# Patient Record
Sex: Male | Born: 1985 | Race: White | Hispanic: No | Marital: Married | Smoking: Former smoker
Health system: Southern US, Community
[De-identification: ages and names within clinical notes are randomized; demographics above are authoritative.]

## PROBLEM LIST (undated history)

## (undated) DIAGNOSIS — T8859XA Other complications of anesthesia, initial encounter: Secondary | ICD-10-CM

## (undated) DIAGNOSIS — Z8489 Family history of other specified conditions: Secondary | ICD-10-CM

## (undated) DIAGNOSIS — R519 Headache, unspecified: Secondary | ICD-10-CM

## (undated) DIAGNOSIS — K219 Gastro-esophageal reflux disease without esophagitis: Secondary | ICD-10-CM

## (undated) DIAGNOSIS — R51 Headache: Secondary | ICD-10-CM

## (undated) DIAGNOSIS — T4145XA Adverse effect of unspecified anesthetic, initial encounter: Secondary | ICD-10-CM

## (undated) HISTORY — PX: MOUTH SURGERY: SHX715

---

## 2004-08-19 HISTORY — PX: FRACTURE SURGERY: SHX138

## 2004-08-25 ENCOUNTER — Emergency Department: Payer: Self-pay | Admitting: Unknown Physician Specialty

## 2005-05-06 ENCOUNTER — Emergency Department: Payer: Self-pay | Admitting: Emergency Medicine

## 2009-05-05 ENCOUNTER — Emergency Department: Payer: Self-pay | Admitting: Emergency Medicine

## 2010-02-01 ENCOUNTER — Ambulatory Visit: Payer: Self-pay | Admitting: Internal Medicine

## 2010-05-18 ENCOUNTER — Ambulatory Visit: Payer: Self-pay | Admitting: Internal Medicine

## 2010-10-09 ENCOUNTER — Ambulatory Visit: Payer: Self-pay | Admitting: Internal Medicine

## 2010-11-28 ENCOUNTER — Ambulatory Visit: Payer: Self-pay | Admitting: Internal Medicine

## 2011-04-11 ENCOUNTER — Emergency Department: Payer: Self-pay | Admitting: Emergency Medicine

## 2011-04-21 ENCOUNTER — Emergency Department: Payer: Self-pay | Admitting: *Deleted

## 2011-09-02 ENCOUNTER — Ambulatory Visit: Payer: Self-pay | Admitting: Internal Medicine

## 2011-09-02 LAB — RAPID INFLUENZA A&B ANTIGENS

## 2011-09-04 LAB — BETA STREP CULTURE(ARMC)

## 2013-04-08 ENCOUNTER — Ambulatory Visit: Payer: Self-pay | Admitting: Emergency Medicine

## 2013-04-08 LAB — DOT URINE DIP
Glucose,UR: 100 mg/dL (ref 0–75)
Protein: NEGATIVE

## 2013-09-01 ENCOUNTER — Emergency Department: Payer: Self-pay | Admitting: Internal Medicine

## 2014-07-08 ENCOUNTER — Emergency Department: Payer: Self-pay | Admitting: Emergency Medicine

## 2014-07-08 LAB — CBC
HCT: 41.8 % (ref 40.0–52.0)
HGB: 14.8 g/dL (ref 13.0–18.0)
MCH: 31.4 pg (ref 26.0–34.0)
MCHC: 35.4 g/dL (ref 32.0–36.0)
MCV: 89 fL (ref 80–100)
PLATELETS: 250 10*3/uL (ref 150–440)
RBC: 4.7 10*6/uL (ref 4.40–5.90)
RDW: 12.3 % (ref 11.5–14.5)
WBC: 8.4 10*3/uL (ref 3.8–10.6)

## 2014-07-08 LAB — BASIC METABOLIC PANEL
Anion Gap: 5 — ABNORMAL LOW (ref 7–16)
BUN: 17 mg/dL (ref 7–18)
CALCIUM: 8.9 mg/dL (ref 8.5–10.1)
Chloride: 106 mmol/L (ref 98–107)
Co2: 27 mmol/L (ref 21–32)
Creatinine: 0.85 mg/dL (ref 0.60–1.30)
EGFR (Non-African Amer.): >60
Glucose: 96 mg/dL (ref 65–99)
Osmolality: 277 (ref 275–301)
Potassium: 3.7 mmol/L (ref 3.5–5.1)
Sodium: 138 mmol/L (ref 136–145)

## 2014-07-08 LAB — TROPONIN I

## 2014-07-08 LAB — LIPASE, BLOOD: LIPASE: 166 U/L (ref 73–393)

## 2014-07-09 LAB — CK: CK, Total: 216 U/L (ref 39–308)

## 2014-07-09 LAB — DRUG SCREEN, URINE
Amphetamines, Ur Screen: NEGATIVE (ref ?–1000)
BARBITURATES, UR SCREEN: NEGATIVE (ref ?–200)
BENZODIAZEPINE, UR SCRN: NEGATIVE (ref ?–200)
CANNABINOID 50 NG, UR ~~LOC~~: NEGATIVE (ref ?–50)
COCAINE METABOLITE, UR ~~LOC~~: NEGATIVE (ref ?–300)
MDMA (Ecstasy)Ur Screen: NEGATIVE (ref ?–500)
Methadone, Ur Screen: NEGATIVE (ref ?–300)
Opiate, Ur Screen: POSITIVE (ref ?–300)
Phencyclidine (PCP) Ur S: NEGATIVE (ref ?–25)
TRICYCLIC, UR SCREEN: NEGATIVE (ref ?–1000)

## 2014-12-08 ENCOUNTER — Encounter: Payer: Self-pay | Admitting: Family Medicine

## 2014-12-08 DIAGNOSIS — Z8249 Family history of ischemic heart disease and other diseases of the circulatory system: Secondary | ICD-10-CM | POA: Insufficient documentation

## 2014-12-08 DIAGNOSIS — F17200 Nicotine dependence, unspecified, uncomplicated: Secondary | ICD-10-CM

## 2015-02-27 ENCOUNTER — Emergency Department
Admission: EM | Admit: 2015-02-27 | Discharge: 2015-02-27 | Disposition: A | Discharge status: Home / Self Care | Payer: Worker's Compensation | Attending: Emergency Medicine | Admitting: Emergency Medicine

## 2015-02-27 ENCOUNTER — Encounter: Payer: Self-pay | Admitting: Emergency Medicine

## 2015-02-27 DIAGNOSIS — G8929 Other chronic pain: Secondary | ICD-10-CM | POA: Diagnosis not present

## 2015-02-27 DIAGNOSIS — Z720 Tobacco use: Secondary | ICD-10-CM | POA: Diagnosis not present

## 2015-02-27 DIAGNOSIS — M25511 Pain in right shoulder: Secondary | ICD-10-CM | POA: Diagnosis present

## 2015-02-27 DIAGNOSIS — Z87828 Personal history of other (healed) physical injury and trauma: Secondary | ICD-10-CM | POA: Insufficient documentation

## 2015-02-27 DIAGNOSIS — Z880 Allergy status to penicillin: Secondary | ICD-10-CM | POA: Insufficient documentation

## 2015-02-27 MED ORDER — PREDNISONE 10 MG PO TABS: ORAL_TABLET | ORAL | Status: DC
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
## 2015-09-21 ENCOUNTER — Encounter: Payer: Self-pay | Admitting: *Deleted

## 2015-09-21 ENCOUNTER — Ambulatory Visit
Admission: EM | Admit: 2015-09-21 | Discharge: 2015-09-21 | Disposition: A | Discharge status: Home / Self Care | Payer: Self-pay | Attending: Family Medicine | Admitting: Family Medicine

## 2015-09-21 DIAGNOSIS — Z029 Encounter for administrative examinations, unspecified: Secondary | ICD-10-CM

## 2015-09-21 DIAGNOSIS — Z024 Encounter for examination for driving license: Secondary | ICD-10-CM

## 2015-09-21 LAB — DEPT OF TRANSP DIPSTICK, URINE (ARMC ONLY)
GLUCOSE, UA: NEGATIVE mg/dL
Hgb urine dipstick: NEGATIVE
Protein, ur: NEGATIVE mg/dL
Specific Gravity, Urine: 1.025 (ref 1.005–1.030)

## 2015-09-21 NOTE — ED Notes (Signed)
Patient here for [place] CD[place] physicial
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
## 2016-04-15 ENCOUNTER — Encounter: Payer: Self-pay | Admitting: *Deleted

## 2016-04-15 ENCOUNTER — Ambulatory Visit
Admission: EM | Admit: 2016-04-15 | Discharge: 2016-04-15 | Disposition: A | Discharge status: Home / Self Care | Payer: BLUE CROSS/BLUE SHIELD | Attending: Family Medicine | Admitting: Family Medicine

## 2016-04-15 DIAGNOSIS — S8392XA Sprain of unspecified site of left knee, initial encounter: Secondary | ICD-10-CM | POA: Diagnosis not present

## 2016-04-15 MED ORDER — HYDROCODONE-ACETAMINOPHEN 5-325 MG PO TABS: ORAL_TABLET | ORAL | 0 refills | Status: DC

## 2016-04-15 NOTE — ED Triage Notes (Signed)
Patient stood up and twisted his left knee 2 days ago. Pain has become worse since knee  injury.

## 2016-04-15 NOTE — ED Provider Notes (Signed)
[facility]    CSN: [id number] Arrival date & time: 04/15/16  1144  First Provider Contact:  None       History   Chief Complaint Chief Complaint  Patient presents with  . Knee Pain    HPI Adam Hendricks is a 30 y.o. male.   The history is provided by the patient.  Knee Pain  Location:  Knee Time since incident:  2 days Injury: yes   Mechanism of injury comment:  Twisting Knee location:  L knee Pain details:    Quality:  Aching   Radiates to:  Does not radiate   Severity:  Moderate   Onset quality:  Sudden Chronicity:  New Dislocation: no   Foreign body present:  No foreign bodies Prior injury to area:  No Relieved by:  Nothing Worsened by:  Rotation and flexion Ineffective treatments:  Ice and elevation Associated symptoms: swelling   Associated symptoms: no fever and no numbness   Risk factors: no frequent fractures, no known bone disorder and no recent illness   Patient stood up and twisted his left knee 2 days ago. Pain has become worse since knee  injury.  History reviewed. No pertinent past medical history.  Patient Active Problem List   Diagnosis Date Noted  . Smoker 12/08/2014  . FH: congestive heart failure 12/08/2014    Past Surgical History:  Procedure Laterality Date  . FRACTURE SURGERY         Home Medications    Prior to Admission medications   Medication Sig Start Date End Date Taking? Authorizing Provider  ibuprofen (ADVIL,MOTRIN) 800 MG tablet Take 800 mg by mouth every 6 (six) hours as needed.   Yes Historical Provider, MD  HYDROcodone-acetaminophen (NORCO/VICODIN) 5-325 MG tablet 1-2 tabs po qd 04/15/16   Payton Mccallumrlando Irish Breisch, MD  predniSONE (DELTASONE) 10 MG tablet Take 6 tablets  today, on day 2 take 5 tablets, day 3 take 4 tablets, day 4 take 3 tablets, day 5 take  2 tablets and 1 tablet the last day 02/27/15   Tommi Rumpshonda L Summers, PA-C    Family History History reviewed. No pertinent family history.  Social History Social  History  Substance Use Topics  . Smoking status: Current Every Day Smoker  . Smokeless tobacco: Never Used  . Alcohol use Yes     Comment: occ.     Allergies   Penicillins   Review of Systems Review of Systems  Constitutional: Negative for fever.     Physical Exam Triage Vital Signs ED Triage Vitals  Enc Vitals Group     BP 04/15/16 1224 103/65     Pulse Rate 04/15/16 1224 67     Resp 04/15/16 1224 18     Temp 04/15/16 1224 97.8 F (36.6 C)     Temp Source 04/15/16 1224 Oral     SpO2 04/15/16 1224 100 %     Weight 04/15/16 1226 150 lb (68 kg)     Height 04/15/16 1226 5\' 10"  (1.778 m)     Head Circumference --      Peak Flow --      Pain Score 04/15/16 1228 6     Pain Loc --      Pain Edu? --      Excl. in GC? --    No data found.   Updated Vital Signs BP 103/65 (BP Location: Left Arm)   Pulse 67   Temp 97.8 F (36.6 C) (Oral)   Resp 18  Ht 5\' 10"  (1.778 m)   Wt 150 lb (68 kg)   SpO2 100%   BMI 21.52 kg/m   Visual Acuity Right Eye Distance:   Left Eye Distance:   Bilateral Distance:    Right Eye Near:   Left Eye Near:    Bilateral Near:     Physical Exam  Constitutional: He appears well-developed and well-nourished. No distress.  Musculoskeletal:       Left knee: He exhibits swelling (mild). He exhibits normal range of motion, no effusion, no ecchymosis, no deformity, no laceration, no erythema, normal alignment, no LCL laxity, normal patellar mobility, no bony tenderness, normal meniscus and no MCL laxity. Tenderness found. MCL tenderness noted. No medial joint line, no lateral joint line, no LCL and no patellar tendon tenderness noted.  Skin: He is not diaphoretic.  Nursing note and vitals reviewed.    UC Treatments / Results  Labs (all labs ordered are listed, but only abnormal results are displayed) Labs Reviewed - No data to display  EKG  EKG Interpretation None       Radiology No results found.  Procedures Procedures  (including critical care time)  Medications Ordered in UC Medications - No data to display   Initial Impression / Assessment and Plan / UC Course  I have reviewed the triage vital signs and the nursing notes.  Pertinent labs & imaging results that were available during my care of the patient were reviewed by me and considered in my medical decision making (see chart for details).  Clinical Course      Final Clinical Impressions(s) / UC Diagnoses   Final diagnoses:  Left knee sprain, initial encounter    New Prescriptions Discharge Medication List as of 04/15/2016 12:43 PM    START taking these medications   Details  HYDROcodone-acetaminophen (NORCO/VICODIN) 5-325 MG tablet 1-2 tabs po qd, Print       1. diagnosis reviewed with patient 2. rx as per orders above; reviewed possible side effects, interactions, risks and benefits  3. Recommend supportive treatment with rest, ice 4. Follow-up prn if symptoms worsen or don't improve   Payton Mccallum, MD 04/15/16 1256

## 2016-06-06 ENCOUNTER — Other Ambulatory Visit: Payer: Self-pay | Admitting: Orthopedic Surgery

## 2016-06-27 ENCOUNTER — Encounter
Admission: RE | Admit: 2016-06-27 | Discharge: 2016-06-27 | Disposition: A | Discharge status: Home / Self Care | Payer: BLUE CROSS/BLUE SHIELD | Source: Ambulatory Visit | Attending: Orthopedic Surgery | Admitting: Orthopedic Surgery

## 2016-06-27 HISTORY — DX: Adverse effect of unspecified anesthetic, initial encounter: T41.45XA

## 2016-06-27 HISTORY — DX: Other complications of anesthesia, initial encounter: T88.59XA

## 2016-06-27 HISTORY — DX: Family history of other specified conditions: Z84.89

## 2016-06-27 HISTORY — DX: Headache: R51

## 2016-06-27 HISTORY — DX: Headache, unspecified: R51.9

## 2016-06-27 HISTORY — DX: Gastro-esophageal reflux disease without esophagitis: K21.9

## 2016-06-27 NOTE — Patient Instructions (Addendum)
  Your procedure is scheduled on: 07-03-16 [facility](WEDNESDAY) Report to Same Day Surgery 2nd floor medical mall To find out your arrival time please call [phone] between 1PM - 3PM on 07-02-16 (TUESDAY)  Remember: Instructions that are not followed completely may result in serious medical risk, up to and including death, or upon the discretion of your surgeon and anesthesiologist your surgery may need to be rescheduled.    _x___ 1. Do not eat food or drink liquids after midnight. No gum chewing or hard candies.     __x__ 2. No Alcohol for 24 hours before or after surgery.   __x__3. No Smoking for 24 prior to surgery.   ____  4. Bring all medications with you on the day of surgery if instructed.    __x__ 5. Notify your doctor if there is any change in your medical condition     (cold, fever, infections).     Do not wear jewelry, make-up, hairpins, clips or nail polish.  Do not wear lotions, powders, or perfumes. You may wear deodorant.  Do not shave 48 hours prior to surgery. Men may shave face and neck.  Do not bring valuables to the hospital.    [facility] is not responsible for any belongings or valuables.               Contacts, dentures or bridgework may not be worn into surgery.  Leave your suitcase in the car. After surgery it may be brought to your room.  For patients admitted to the hospital, discharge time is determined by your treatment team.   Patients discharged the day of surgery will not be allowed to drive home.    Please read over the following fact sheets that you were given:   [facility] Preparing for Surgery and or MRSA Information   ____ Take these medicines the morning of surgery with A SIP OF WATER:    1. NONE  2.  3.  4.  5.  6.  ____Fleets enema or Magnesium Citrate as directed.   _x___ Use CHG Soap or sage wipes as directed on instruction sheet   ____ Use inhalers on the day of surgery and bring to hospital day of surgery  ____ Stop metformin 2  days prior to surgery    ____ Take 1/2 of usual insulin dose the night before surgery and none on the morning of  surgery.   ____ Stop aspirin or coumadin, or plavix  x__ Stop Anti-inflammatories such as Advil, Aleve, Ibuprofen, Motrin, Naproxen,          Naprosyn, Goodies powders or aspirin products-STOP IBUPROFEN AND NAPROXEN NOW- Ok to take Tylenol OR TRAMADOL IF NEEDED   ____ Stop supplements until after surgery.    ____ Bring C-Pap to the hospital.

## 2016-07-03 ENCOUNTER — Ambulatory Visit: Admit: 2016-07-03 | Payer: BLUE CROSS/BLUE SHIELD | Admitting: Orthopedic Surgery

## 2016-07-03 SURGERY — ARTHROSCOPY, KNEE
Anesthesia: Choice | Laterality: Left

## 2016-10-27 ENCOUNTER — Ambulatory Visit
Admission: EM | Admit: 2016-10-27 | Discharge: 2016-10-27 | Disposition: A | Discharge status: Home / Self Care | Payer: BLUE CROSS/BLUE SHIELD | Attending: Family Medicine | Admitting: Family Medicine

## 2016-10-27 ENCOUNTER — Encounter: Payer: Self-pay | Admitting: Gynecology

## 2016-10-27 ENCOUNTER — Ambulatory Visit (INDEPENDENT_AMBULATORY_CARE_PROVIDER_SITE_OTHER): Payer: BLUE CROSS/BLUE SHIELD

## 2016-10-27 DIAGNOSIS — M542 Cervicalgia: Secondary | ICD-10-CM | POA: Diagnosis not present

## 2016-10-27 DIAGNOSIS — S5011XA Contusion of right forearm, initial encounter: Secondary | ICD-10-CM

## 2016-10-27 DIAGNOSIS — S46811A Strain of other muscles, fascia and tendons at shoulder and upper arm level, right arm, initial encounter: Secondary | ICD-10-CM

## 2016-10-27 MED ORDER — CYCLOBENZAPRINE HCL 10 MG PO TABS: 10.0000 mg | ORAL_TABLET | Freq: Every evening | ORAL | 0 refills | Status: DC | PRN

## 2016-10-27 NOTE — Discharge Instructions (Signed)
Take medication as prescribed. Rest. Drink plenty of fluids.   Follow up with your primary care physician or the above, this week as needed. Return to Urgent care for new or worsening concerns.
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
## 2017-01-27 ENCOUNTER — Encounter: Payer: Self-pay | Admitting: *Deleted

## 2017-01-27 ENCOUNTER — Ambulatory Visit
Admission: EM | Admit: 2017-01-27 | Discharge: 2017-01-27 | Disposition: A | Discharge status: Home / Self Care | Payer: BLUE CROSS/BLUE SHIELD | Attending: Family Medicine | Admitting: Family Medicine

## 2017-01-27 DIAGNOSIS — S50862A Insect bite (nonvenomous) of left forearm, initial encounter: Secondary | ICD-10-CM

## 2017-01-27 DIAGNOSIS — W57XXXA Bitten or stung by nonvenomous insect and other nonvenomous arthropods, initial encounter: Secondary | ICD-10-CM | POA: Diagnosis not present

## 2017-01-27 DIAGNOSIS — M7989 Other specified soft tissue disorders: Secondary | ICD-10-CM

## 2017-01-27 DIAGNOSIS — M79632 Pain in left forearm: Secondary | ICD-10-CM

## 2017-01-27 DIAGNOSIS — R2232 Localized swelling, mass and lump, left upper limb: Secondary | ICD-10-CM | POA: Diagnosis not present

## 2017-01-27 MED ORDER — EPINEPHRINE 0.3 MG/0.3ML IJ SOAJ: 0.3000 mg | Freq: Once | INTRAMUSCULAR | 0 refills | Status: AC

## 2017-01-27 MED ORDER — RANITIDINE HCL 150 MG PO TABS: 150.0000 mg | ORAL_TABLET | Freq: Two times a day (BID) | ORAL | 0 refills | Status: DC

## 2017-01-27 MED ORDER — SULFAMETHOXAZOLE-TRIMETHOPRIM 800-160 MG PO TABS: 1.0000 | ORAL_TABLET | Freq: Two times a day (BID) | ORAL | 0 refills | Status: AC

## 2017-01-27 MED ORDER — PREDNISONE 10 MG PO TABS: ORAL_TABLET | ORAL | 0 refills | Status: DC

## 2017-01-27 NOTE — ED Provider Notes (Signed)
[facility] ____________________________________________  Time seen: Approximately 6:44 PM  I have reviewed the triage vital signs and the nursing notes.   HISTORY  Chief Complaint Insect Bite  HPI Adam Hendricks is a 31 y.o. male presenting for evaluation of left forearm swelling after being stung by a wasp last night. Patient reports last night he was at home and he was working on a truck, opened Engineer, maintenancetool box and a wasp came out and stung him on his left forearm. Patient reports last night he only had a small red area. Patient reports this morning the area slightly gotten more swollen and then states today as the day progressed he is forearm became more swollen. Patient states the area feels tight and slightly painful. States otherwise left arm is okay without pain or swelling sensation. States minimal redness. States he had a slight drainage from the site earlier today, denies any other drainage. Denies fall, trauma or injury. Patient reports he did take over-the-counter Benadryl last night and this morning without change. Patient states that his mother told him when he was little he got stung by a wasp to his face and had facial swelling. Reports he has been stung multiple times otherwise in the past without any swelling or issues. Denies any history of anaphylaxis from wasp stings or bee stings. Reports continued to eat and drink well today.   States mild discomfort to his left forearm area of swelling currently. Denies paresthesias, decreased range of motion or weakness. Patient reports that he continued to work today and drink. Reports that he works as a [employer]ic with a lot of arm movement. Denies any difficulty swallowing. Denies any wheezing or shortness of breath. Denies any lip tongue oral or facial swelling or sensation changes.  Denies chest pain, shortness of breath, abdominal pain. Denies recent sickness. Denies recent antibiotic use. Reports tetanus immunization was 6  years.   Past Medical History:  Diagnosis Date  . Complication of anesthesia    PT HAD NITROUS OXIDE FOR AN ABSCESSED TOOTH AND HE STATES IT TOOK ABOUT 3 HOURS FOR BP TO COME UP  . Family history of adverse reaction to anesthesia    MOM-LOW BP WITH ANESTHESIA  . GERD (gastroesophageal reflux disease)    OCC-NO MEDS  . Headache    H/O MIGRAINES    Patient Active Problem List   Diagnosis Date Noted  . Smoker 12/08/2014  . FH: congestive heart failure 12/08/2014    Past Surgical History:  Procedure Laterality Date  . FRACTURE SURGERY  2006   RIGHT FEMUR  . MOUTH SURGERY       No current facility-administered medications for this encounter.   Current Outpatient Prescriptions:  .  cyclobenzaprine (FLEXERIL) 10 MG tablet, Take 1 tablet (10 mg total) by mouth at bedtime as needed for muscle spasms. Do not drive or operate machinery while taking as can cause drowsiness., Disp: 7 tablet, Rfl: 0 .  EPINEPHrine (EPIPEN 2-PAK) 0.3 mg/0.3 mL IJ SOAJ injection, Inject 0.3 mLs (0.3 mg total) into the muscle once., Disp: 1 Device, Rfl: 0 .  ibuprofen (ADVIL,MOTRIN) 200 MG tablet, Take 800 mg by mouth every 6 (six) hours as needed (for pain.)., Disp: , Rfl:  .  naproxen sodium (ANAPROX) 220 MG tablet, Take 220-440 mg by mouth 2 (two) times daily as needed (for pain.)., Disp: , Rfl:  .  predniSONE (DELTASONE) 10 MG tablet, Start 60 mg po day one, then 50 mg po day two, taper by 10 mg  daily until complete., Disp: 21 tablet, Rfl: 0 .  ranitidine (ZANTAC) 150 MG tablet, Take 1 tablet (150 mg total) by mouth 2 (two) times daily., Disp: 10 tablet, Rfl: 0 .  sulfamethoxazole-trimethoprim (BACTRIM DS,SEPTRA DS) 800-160 MG tablet, Take 1 tablet by mouth 2 (two) times daily., Disp: 14 tablet, Rfl: 0 .  traMADol (ULTRAM) 50 MG tablet, Take 50 mg by mouth every 6 (six) hours as needed for moderate pain., Disp: , Rfl:   Allergies Penicillins; Bee venom; and Other  family history Denies others in  household with similar  Social History Social History  Substance Use Topics  . Smoking status: Current Every Day Smoker    Packs/day: 1.00    Years: 18.00    Types: Cigarettes  . Smokeless tobacco: Never Used  . Alcohol use Yes     Comment: occ.    Review of Systems Constitutional: No fever/chills Eyes: No visual changes. ENT: No sore throat. Cardiovascular: Denies chest pain. Respiratory: Denies shortness of breath. Gastrointestinal: No abdominal pain. Musculoskeletal: Negative for back pain. Skin: As above.  ____________________________________________   PHYSICAL EXAM:  VITAL SIGNS: ED Triage Vitals  Enc Vitals Group     BP 01/27/17 1806 124/61     Pulse Rate 01/27/17 1806 81     Resp 01/27/17 1806 16     Temp 01/27/17 1806 98.4 F (36.9 C)     Temp Source 01/27/17 1806 Oral     SpO2 01/27/17 1806 99 %     Weight 01/27/17 1808 155 lb (70.3 kg)     Height 01/27/17 1808 5\' 9"  (1.753 m)     Head Circumference --      Peak Flow --      Pain Score 01/27/17 1808 7     Pain Loc --      Pain Edu? --      Excl. in GC? --     Constitutional: Alert and oriented. Well appearing and in no acute distress. ENT      Head: Normocephalic and atraumatic.No facial swelling noted.      Nose: No congestion/rhinnorhea.      Mouth/Throat: Mucous membranes are moist.Oropharynx non-erythematous.No lip, tongue or oral swelling. No facial swelling. Neck: No stridor. Supple without meningismus.  Hematological/Lymphatic/Immunilogical: No cervical lymphadenopathy. Cardiovascular: Normal rate, regular rhythm. Grossly normal heart sounds.  Good peripheral circulation. Respiratory: Normal respiratory effort without tachypnea nor retractions. Breath sounds are clear and equal bilaterally. No wheezes, rales, rhonchi. Musculoskeletal:  Ambulatory with steady gait.  Neurologic:  Normal speech and language. No gross focal neurologic deficits are appreciated. Speech is normal. No gait  instability.  Skin:  Skin is warm, dry. Except: Left forearm below elbow to wrist diffuse mild to moderate edema noted, left forearm x 1 circular mild erythematous area with centered punctum, no foreign body noted, no fluctuance, no induration, no drainage, left forearm with slight diffuse erythematous appearance, left arm with mild generalized tenderness to palpation, no bony tenderness. Bilateral hand grips strong and equal. Bilateral distal radial pulses equal. Left upper extremity with full range of motion present. Left hand normal distal sensation and capillary refill, no motor or tendon deficits.  Psychiatric: Mood and affect are normal. Speech and behavior are normal. Patient exhibits appropriate insight and judgment   ___________________________________________   LABS (all labs ordered are listed, but only abnormal results are displayed)  Labs Reviewed - No data to display ____________________________________________  PROCEDURES Procedures    INITIAL IMPRESSION / ASSESSMENT AND PLAN / ED  COURSE  Pertinent labs & imaging results that were available during my care of the patient were reviewed by me and considered in my medical decision making (see chart for details).  Very well-appearing patient. No acute distress. Suspect localized reaction from insect sting. No clear evidence of cellulitis, but concern for secondary onset of cellulitis. Patient denies any recent antibiotic use. Will treat patient with oral prednisone taper, Zantac, over-the-counter Benadryl at night, Claritin during the day, and oral Bactrim. Encouraged elevation and rest and ice. Discussed with patient with local reaction as well as history of concern of facial swelling after insect sting, EpiPen Rx given, and directed in use. Discussed strict follow-up and return parameters including any increased swelling, worsening redness, paresthesias, decreased range of motion or weakness  As well as worsening concerns.Discussed  indication, risks and benefits of medications with patient.   Discussed follow up with Primary care physician this week. Discussed follow up and return parameters including no resolution or any worsening concerns. Patient verbalized understanding and agreed to plan.    ____________________________________________   FINAL CLINICAL IMPRESSION(S) / ED DIAGNOSES  Final diagnoses:  Insect bite of left forearm with local reaction, initial encounter  Pain and swelling of forearm, left     Discharge Medication List as of 01/27/2017  7:01 PM    START taking these medications   Details  EPINEPHrine (EPIPEN 2-PAK) 0.3 mg/0.3 mL IJ SOAJ injection Inject 0.3 mLs (0.3 mg total) into the muscle once., Starting Mon 01/27/2017, Normal    predniSONE (DELTASONE) 10 MG tablet Start 60 mg po day one, then 50 mg po day two, taper by 10 mg daily until complete., Normal    ranitidine (ZANTAC) 150 MG tablet Take 1 tablet (150 mg total) by mouth 2 (two) times daily., Starting Mon 01/27/2017, Normal    sulfamethoxazole-trimethoprim (BACTRIM DS,SEPTRA DS) 800-160 MG tablet Take 1 tablet by mouth 2 (two) times daily., Starting Mon 01/27/2017, Until Mon 02/03/2017, Normal        Note: This dictation was prepared with Dragon dictation along with smaller phrase technology. Any transcriptional errors that result from this process are unintentional.         Renford Dills, NP 01/27/17 1926    Renford Dills, NP 01/27/17 1927

## 2017-01-27 NOTE — Discharge Instructions (Signed)
Take medication as prescribed. Rest. Drink plenty of fluids. Ice, elevate. Take benadryl at night/Claritin daily. Avoid triggers.   Follow up with your primary care physician this week as needed. Return to Urgent care or Emergency room for increase swelling, redness, pain, new or worsening concerns.

## 2017-01-27 NOTE — ED Triage Notes (Signed)
Patient was stung by WASP last PM. Benadryl taken with no resolution of symptoms.

## 2017-03-25 ENCOUNTER — Ambulatory Visit
Admission: EM | Admit: 2017-03-25 | Discharge: 2017-03-25 | Disposition: A | Discharge status: Home / Self Care | Payer: BLUE CROSS/BLUE SHIELD | Attending: Emergency Medicine | Admitting: Emergency Medicine

## 2017-03-25 ENCOUNTER — Encounter: Payer: Self-pay | Admitting: *Deleted

## 2017-03-25 DIAGNOSIS — S30861A Insect bite (nonvenomous) of abdominal wall, initial encounter: Secondary | ICD-10-CM

## 2017-03-25 DIAGNOSIS — T7840XA Allergy, unspecified, initial encounter: Secondary | ICD-10-CM | POA: Diagnosis not present

## 2017-03-25 DIAGNOSIS — T63464A Toxic effect of venom of wasps, undetermined, initial encounter: Secondary | ICD-10-CM

## 2017-03-25 MED ORDER — IBUPROFEN 600 MG PO TABS: 600.0000 mg | ORAL_TABLET | Freq: Once | ORAL | Status: DC

## 2017-03-25 MED ORDER — DIPHENHYDRAMINE HCL 50 MG PO CAPS
50.0000 mg | ORAL_CAPSULE | Freq: Once | ORAL | Status: AC
  Administered 2017-03-25: 50 mg via ORAL

## 2017-03-25 MED ORDER — FAMOTIDINE 40 MG PO TABS
40.0000 mg | ORAL_TABLET | Freq: Once | ORAL | Status: AC
  Administered 2017-03-25: 40 mg via ORAL

## 2017-03-25 MED ORDER — LORATADINE 10 MG PO TABS: 10.0000 mg | ORAL_TABLET | Freq: Every day | ORAL | 0 refills | Status: DC

## 2017-03-25 MED ORDER — FAMOTIDINE 20 MG PO TABS: 20.0000 mg | ORAL_TABLET | Freq: Two times a day (BID) | ORAL | 0 refills | Status: DC

## 2017-03-25 MED ORDER — PREDNISONE 50 MG PO TABS
60.0000 mg | ORAL_TABLET | Freq: Once | ORAL | Status: AC
  Administered 2017-03-25: 15:00:00 60 mg via ORAL

## 2017-03-25 MED ORDER — MUPIROCIN 2 % EX OINT: 1.0000 "application " | TOPICAL_OINTMENT | Freq: Three times a day (TID) | CUTANEOUS | 0 refills | Status: DC

## 2017-03-25 MED ORDER — IBUPROFEN 800 MG PO TABS
800.0000 mg | ORAL_TABLET | Freq: Once | ORAL | Status: AC
  Administered 2017-03-25: 800 mg via ORAL

## 2017-03-25 MED ORDER — PREDNISONE 10 MG (21) PO TBPK: ORAL_TABLET | ORAL | 0 refills | Status: DC

## 2017-03-25 NOTE — Discharge Instructions (Signed)
Take the Claritin and the Pepcid, may take 25-50 mg of Benadryl at night if needed. Finish the steroids. Use the Bactroban to help prevent secondary infection. Follow-up with a primary care physician of your choice. See list below  Here is a list of primary care providers who are taking new patients:  Dr. Elizabeth Sauereanna Jones, Dr. Schuyler AmorWilliam Plonk [address] [place] [address] [phone]  Dr. Everlene OtherJayce Cook [address]  [address]  [place] [address]  [phone]  [facility] [place] [address]  [place] [address]  [phone]  [facility] [address]  [place], [place] [address] [phone]  [facility] [place] [address]  [phone] [place], [place] [address]  Here are clinics/ other resources who will see you if you do not have insurance. Some have certain criteria that you must meet. Call them and find out what they are:  [facility]: [address] [place] [address] [place], [place] [address] Phone: [phone] Hours: First and Third Saturdays of each Month, 9 a.m. - 1 p.m.  [facility]: [address], [place], [place] [address] Phone: [phone] Hours: Tuesday, 4 p.m. - 8 p.m. Thursday, 1 p.m. - 8 p.m. Wednesday, 9 a.m. - [facility]  [facility] [address], [place], [place] [address] Phone: [phone] Pharmacy Phone Number: [phone] Dental Phone Number: [phone] [facility] Insurance Help: [phone]  Dental Hours: Monday - Thursday, 8 a.m. - 6 p.m.  [facility] [address] [place], [place] [address] Phone: [phone] Pharmacy Phone Number: [phone] [facility] Insurance Help: [phone]  [facility] [address] [place], [place] [address] Phone: [phone] Pharmacy Phone Number: [phone] [facility] Insurance Help: [phone]  [facility] [address] [place], [place] [address] Phone: [phone] [facility]  Insurance Help: [phone]   [facility] [address] [place], [place] [address] Phone: [phone]  Go to www.goodrx.com to look up your medications. This will give you a list of where you can find your prescriptions at the most affordable prices. Or ask the pharmacist what the cash price is, or if they have any other discount programs available to help make your medication more affordable. This can be less expensive than what you would pay with insurance.

## 2017-03-25 NOTE — ED Provider Notes (Signed)
HPI  SUBJECTIVE:  Adam Hendricks is a 31 y.o. male who presents with a wasp sting on his right abdomen 1-1/2 hours prior to evaluation. He reports itching, burning and stinging pain, an erythematous area of increasing size. There are no aggravating or alleviating factors. He has not tried anything for this. He denies lip or tongue swelling, difficulty breathing, wheezing, shortness of breath, voice changes, abdominal pain, diarrhea, syncope, flushing, hives. He was stung by a wasp on his hand last month and states that his entire arm became swollen. He did not have any evidence of anaphylaxis at that time that he was sent home with an epi pin, prednisone taper, Zantac, Benadryl at night, Claritin during the day and Bactrim for possible secondary cellulitis.Marland Kitchen. He states that he did not use at this time around. 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 Per previous chart, tetanus immunization 6 years ago. PMD: None.    Past Medical History:  Diagnosis Date  . Complication of anesthesia    PT HAD NITROUS OXIDE FOR AN ABSCESSED TOOTH AND HE STATES IT TOOK ABOUT 3 HOURS FOR BP TO COME UP  . Family history of adverse reaction to anesthesia    MOM-LOW BP WITH ANESTHESIA  . GERD (gastroesophageal reflux disease)    OCC-NO MEDS  . Headache    H/O MIGRAINES    Past Surgical History:  Procedure Laterality Date  . FRACTURE SURGERY  2006   RIGHT FEMUR  . MOUTH SURGERY      History reviewed. No pertinent family history.  Social History  Substance Use Topics  . Smoking status: Current Every Day Smoker    Packs/day: 1.00    Years: 18.00    Types: Cigarettes  . Smokeless tobacco: Never Used  . Alcohol use Yes     Comment: occ.    No current facility-administered medications for this encounter.   Current Outpatient Prescriptions:  .  ibuprofen (ADVIL,MOTRIN) 200 MG tablet, Take 800  mg by mouth every 6 (six) hours as needed (for pain.)., Disp: , Rfl:  .  famotidine (PEPCID) 20 MG tablet, Take 1 tablet (20 mg total) by mouth 2 (two) times daily., Disp: 10 tablet, Rfl: 0 .  loratadine (CLARITIN) 10 MG tablet, Take 1 tablet (10 mg total) by mouth daily., Disp: 10 tablet, Rfl: 0 .  mupirocin ointment (BACTROBAN) 2 %, Apply 1 application topically 3 (three) times daily. Apply after warm soak for 10 minutes, Disp: 22 g, Rfl: 0 .  naproxen sodium (ANAPROX) 220 MG tablet, Take 220-440 mg by mouth 2 (two) times daily as needed (for pain.)., Disp: , Rfl:  .  predniSONE (STERAPRED UNI-PAK 21 TAB) 10 MG (21) TBPK tablet, Dispense one 6 day pack. Take as directed with food., Disp: 21 tablet, Rfl: 0 .  ranitidine (ZANTAC) 150 MG tablet, Take 1 tablet (150 mg total) by mouth 2 (two) times daily., Disp: 10 tablet, Rfl: 0 .  traMADol (ULTRAM) 50 MG tablet, Take 50 mg by mouth every 6 (six) hours as needed for moderate pain., Disp: , Rfl:   Allergies  Allergen Reactions  . Penicillins Anaphylaxis    Has patient had a PCN reaction causing immediate rash, facial/tongue/throat swelling, SOB or lightheadedness with hypotension:Yes Has patient had a PCN reaction causing severe rash involving mucus membranes or skin necrosis: unknown Has patient had a PCN reaction that required hospitalization:Yes  Has patient had a PCN reaction occurring within the last 10 years: no If all of the above answers are "NO", then may proceed with Cephalosporin use.  . Bee Venom Swelling  . Other     ONNIONS-SWELLING, HIVES     ROS  As noted in HPI.   Physical Exam  BP 123/69 (BP Location: Left Arm)   Pulse 72   Temp 98.5 F (36.9 C) (Oral)   Resp 16   Ht 5\' 10"  (1.778 m)   Wt 150 lb (68 kg)   SpO2 97%   BMI 21.52 kg/m   Constitutional: Well developed, well nourished, no acute distress Eyes:  EOMI, conjunctiva normal bilaterally HENT: Normocephalic, atraumatic,mucus membranes moist. No angioedema.  Voice normal Respiratory: Normal inspiratory effort, lungs clear bilaterally Cardiovascular: Normal rate GI: nondistended skin: 9 x 6.5 tender area of blanchable erythema on his right lower abdomen no induration with a central wound. No stinger visualized under magnification. Marked area of erythema with a marker for reference. See picture. No urticaria elsewhere.     Musculoskeletal: no deformities Neurologic: Alert & oriented x 3, no focal neuro deficits Psychiatric: Speech and behavior appropriate   ED Course   Medications  predniSONE (DELTASONE) tablet 60 mg (60 mg Oral Given 03/25/17 1522)  famotidine (PEPCID) tablet 40 mg (40 mg Oral Given 03/25/17 1524)  diphenhydrAMINE (BENADRYL) capsule 50 mg (50 mg Oral Given 03/25/17 1521)  ibuprofen (ADVIL,MOTRIN) tablet 800 mg (800 mg Oral Given 03/25/17 1526)    Orders Placed This Encounter  Procedures  . Ice to affected area    Standing Status:   Standing    Number of Occurrences:   1    No results found for this or any previous visit (from the past 24 hour(s)). No results found.  ED Clinical Impression  Wasp sting, undetermined intent, initial encounter  Allergic reaction, initial encounter  ED Assessment/Plan  Previous records  from this facility reviewed.  As noted in history of present illness  1510 No evidence of anaphylaxis, vitals normal, but patient is having a localized allergic reaction. Given the fact that it was severe last time, we will give him Benadryl 50 mg by mouth, prednisone 60 mg by mouth, Pepcid 40 mg by mouth, ibuprofen 600 mg by mouth, apply ice to the area. meds given 1520. We'll reevaluate at 1600  Pt states that he can gt someone to pick him up.   1600 on reevaluation, patient states that he feels better. The itching and pain is resolving.Erythema is improving. Lungs are still clear. Denies tongue or lip swelling or difficulty breathing.    he states he does not need a prescription for EpiPen as he  has one at home and at work.  Plan to send home with a prednisone taper,  H1 blocker such as Claritin and Benadryl at night, H2 blocker Pepcid , for the allergic reaction and Bactroban to help prevent any secondary infection  Discussed MDM, plan and followup with patient. Discussed sn/sx that should prompt return to the ED. Patient  agrees with plan.   Meds ordered this encounter  Medications  . DISCONTD: ibuprofen (ADVIL,MOTRIN) tablet 600 mg  . predniSONE (DELTASONE) tablet 60 mg  . famotidine (PEPCID) tablet 40 mg  . diphenhydrAMINE (BENADRYL) capsule 50 mg  . ibuprofen (ADVIL,MOTRIN) tablet 800 mg  . famotidine (PEPCID) 20 MG tablet    Sig: Take 1 tablet (20 mg total) by mouth 2 (two) times daily.    Dispense:  10 tablet  Refill:  0  . loratadine (CLARITIN) 10 MG tablet    Sig: Take 1 tablet (10 mg total) by mouth daily.    Dispense:  10 tablet    Refill:  0  . predniSONE (STERAPRED UNI-PAK 21 TAB) 10 MG (21) TBPK tablet    Sig: Dispense one 6 day pack. Take as directed with food.    Dispense:  21 tablet    Refill:  0  . mupirocin ointment (BACTROBAN) 2 %    Sig: Apply 1 application topically 3 (three) times daily. Apply after warm soak for 10 minutes    Dispense:  22 g    Refill:  0    *This clinic note was created using Scientist, clinical (histocompatibility and immunogenetics). Therefore, there may be occasional mistakes despite careful proofreading.  ?   Domenick Gong, MD 03/25/17 [phone]

## 2017-03-25 NOTE — ED Triage Notes (Signed)
Pt stung by insect approx 1 1/2 hours ago. Site is to right abd which is now red, itching and painful. Denies other symptoms.

## 2017-08-20 ENCOUNTER — Ambulatory Visit
Admission: EM | Admit: 2017-08-20 | Discharge: 2017-08-20 | Disposition: A | Discharge status: Home / Self Care | Payer: BLUE CROSS/BLUE SHIELD | Attending: Family Medicine | Admitting: Family Medicine

## 2017-08-20 ENCOUNTER — Ambulatory Visit: Payer: BLUE CROSS/BLUE SHIELD

## 2017-08-20 ENCOUNTER — Ambulatory Visit (INDEPENDENT_AMBULATORY_CARE_PROVIDER_SITE_OTHER): Payer: BLUE CROSS/BLUE SHIELD

## 2017-08-20 DIAGNOSIS — S62639A Displaced fracture of distal phalanx of unspecified finger, initial encounter for closed fracture: Secondary | ICD-10-CM | POA: Diagnosis not present

## 2017-08-20 DIAGNOSIS — S62633A Displaced fracture of distal phalanx of left middle finger, initial encounter for closed fracture: Secondary | ICD-10-CM | POA: Diagnosis not present

## 2017-08-20 DIAGNOSIS — M542 Cervicalgia: Secondary | ICD-10-CM

## 2017-08-20 DIAGNOSIS — W208XXA Other cause of strike by thrown, projected or falling object, initial encounter: Secondary | ICD-10-CM

## 2017-08-20 MED ORDER — CYCLOBENZAPRINE HCL 10 MG PO TABS: 10.0000 mg | ORAL_TABLET | Freq: Three times a day (TID) | ORAL | 0 refills | Status: DC | PRN

## 2017-08-20 MED ORDER — MELOXICAM 15 MG PO TABS: 15.0000 mg | ORAL_TABLET | Freq: Every day | ORAL | 0 refills | Status: DC | PRN

## 2017-08-20 MED ORDER — TRAMADOL HCL 50 MG PO TABS: 50.0000 mg | ORAL_TABLET | Freq: Three times a day (TID) | ORAL | 0 refills | Status: DC | PRN

## 2017-08-20 NOTE — Discharge Instructions (Signed)
Medications as prescribed. ° °Take care ° °Dr. Taniela Feltus  °

## 2017-08-20 NOTE — ED Triage Notes (Signed)
Pt c/o middle finger pain on his left hand. He was working on his truck and dropped a heavy object on it. Pain 10/10, and also stiff neck pain that is keeping him up Pain 7/10

## 2017-08-20 NOTE — ED Provider Notes (Signed)
[facility]    CSN: [id number] Arrival date & time: 08/20/17  1458  History   Chief Complaint Chief Complaint  Patient presents with  . Finger Injury    middle finger, left hand  . Torticollis   HPI  32 year old male presents with a finger injury as well as neck pain.  Finger injury  Left middle finger.  Occurred 2 weeks ago.  Patient states that he dropped a heavy object on the tip of his finger.  That he continues to have significant pain in he states swelling.  He has had no relief with over-the-counter Aleve.  Pain is currently severe.  No other associated symptoms.  Neck pain  Patient reports a one-week history of right-sided neck pain and decreased range of motion.  He has had some relief with hot shower.  No recent fall, trauma, injury of the C-spine.  He feels that it may be muscular.  No reports of numbness or paresthesias.  No other complaints or concerns at this time.  Past Medical History:  Diagnosis Date  . Complication of anesthesia    PT HAD NITROUS OXIDE FOR AN ABSCESSED TOOTH AND HE STATES IT TOOK ABOUT 3 HOURS FOR BP TO COME UP  . Family history of adverse reaction to anesthesia    MOM-LOW BP WITH ANESTHESIA  . GERD (gastroesophageal reflux disease)    OCC-NO MEDS  . Headache    H/O MIGRAINES    Patient Active Problem List   Diagnosis Date Noted  . Smoker 12/08/2014  . FH: congestive heart failure 12/08/2014    Past Surgical History:  Procedure Laterality Date  . FRACTURE SURGERY  2006   RIGHT FEMUR  . MOUTH SURGERY      Home Medications    Prior to Admission medications   Medication Sig Start Date End Date Taking? Authorizing Provider  cyclobenzaprine (FLEXERIL) 10 MG tablet Take 1 tablet (10 mg total) by mouth 3 (three) times daily as needed for muscle spasms. 08/20/17   Tommie Sams, DO  meloxicam (MOBIC) 15 MG tablet Take 1 tablet (15 mg total) by mouth daily as needed for pain. 08/20/17   Tommie Sams, DO    traMADol (ULTRAM) 50 MG tablet Take 1 tablet (50 mg total) by mouth every 8 (eight) hours as needed. 08/20/17   Tommie Sams, DO    Family History History reviewed. No pertinent family history.  Social History Social History   Tobacco Use  . Smoking status: Current Every Day Smoker    Packs/day: 1.00    Years: 18.00    Pack years: 18.00    Types: Cigarettes  . Smokeless tobacco: Never Used  Substance Use Topics  . Alcohol use: Yes    Comment: occ.  . Drug use: No     Allergies   Penicillins; Bee venom; and Other   Review of Systems Review of Systems  Constitutional: Negative.   Musculoskeletal: Positive for neck pain and neck stiffness.       Finger pain.   Physical Exam Triage Vital Signs ED Triage Vitals  Enc Vitals Group     BP 08/20/17 1520 115/72     Pulse Rate 08/20/17 1520 64     Resp 08/20/17 1520 18     Temp 08/20/17 1520 98.3 F (36.8 C)     Temp Source 08/20/17 1520 Oral     SpO2 08/20/17 1520 100 %     Weight 08/20/17 1518 155 lb (70.3 kg)  Height 08/20/17 1518 5\' 10"  (1.778 m)     Head Circumference --      Peak Flow --      Pain Score 08/20/17 1518 10     Pain Loc --      Pain Edu? --      Excl. in GC? --    Updated Vital Signs BP 115/72 (BP Location: Right Arm)   Pulse 64   Temp 98.3 F (36.8 C) (Oral)   Resp 18   Ht 5\' 10"  (1.778 m)   Wt 155 lb (70.3 kg)   SpO2 100%   BMI 22.24 kg/m    Physical Exam  Constitutional: He is oriented to person, place, and time. He appears well-developed. No distress.  HENT:  Head: Normocephalic and atraumatic.  Neck:  Decreased range of motion particularly in left rotation.  Tightness of the right trapezius muscle noted.  Cardiovascular: Normal rate and regular rhythm.  No murmur heard. Pulmonary/Chest: Effort normal and breath sounds normal. No respiratory distress. He has no wheezes. He has no rales.  Musculoskeletal:  Left middle finger -tenderness of the distal phalanx to palpation.   Normal range of motion of the DIP, PIP, and MCP joints.    Neurological: He is alert and oriented to person, place, and time.  Psychiatric: He has a normal mood and affect.  Nursing note and vitals reviewed.  UC Treatments / Results  Labs (all labs ordered are listed, but only abnormal results are displayed) Labs Reviewed - No data to display  EKG  EKG Interpretation None       Radiology Dg Cervical Spine 2-3 Views  Result Date: 08/20/2017 CLINICAL DATA:  Pain in lower posterior neck into bilateral shoulder blades with decreased range of motion. No known injury. EXAM: CERVICAL SPINE - 2-3 VIEW COMPARISON:  None. FINDINGS: Osseous alignment is normal. Bone mineralization is normal. No fracture line or displaced fracture fragment. No acute or suspicious osseous lesion. No degenerative change seen. Pro prevertebral soft tissues are normal in thickness. IMPRESSION: Normal appearance of the cervical spine. Electronically Signed   By: Bary RichardStan  Maynard M.D.   On: 08/20/2017 16:03   Dg Finger Middle Left  Result Date: 08/20/2017 CLINICAL DATA:  Injury finger 2 weeks ago. EXAM: LEFT MIDDLE FINGER 2+V COMPARISON:  None. FINDINGS: Slightly displaced/comminuted fracture involving the tuft of the distal phalanx. DIP joint remains normally aligned. Associated soft tissue swelling. IMPRESSION: Slightly displaced/comminuted fracture involving the tuft of the distal phalanx. Electronically Signed   By: Bary RichardStan  Maynard M.D.   On: 08/20/2017 16:00    Procedures Procedures (including critical care time)  Medications Ordered in UC Medications - No data to display   Initial Impression / Assessment and Plan / UC Course  I have reviewed the triage vital signs and the nursing notes.  Pertinent labs & imaging results that were available during my care of the patient were reviewed by me and considered in my medical decision making (see chart for details).     32 year old male presents with a tuft fracture.   Also with musculoskeletal neck pain.  Treating with Flexeril, tramadol, meloxicam.  Placed in splint to prevent further injury.  Final Clinical Impressions(s) / UC Diagnoses   Final diagnoses:  Closed fracture of tuft of distal phalanx of finger  Neck pain    ED Discharge Orders        Ordered    meloxicam (MOBIC) 15 MG tablet  Daily PRN     08/20/17 1635  traMADol (ULTRAM) 50 MG tablet  Every 8 hours PRN     08/20/17 1635    cyclobenzaprine (FLEXERIL) 10 MG tablet  3 times daily PRN     08/20/17 1635     Controlled Substance Prescriptions [place] Controlled Substance Registry consulted? Yes, I have consulted the [place] Controlled Substances Registry for this patient, and feel the risk/benefit ratio today is favorable for proceeding with this prescription for a controlled substance.  Patient has not received a prescription for narcotic since 2017.   Tommie Sams, [place] 08/20/17 [phone]

## 2017-09-12 ENCOUNTER — Other Ambulatory Visit: Payer: Self-pay | Admitting: Family Medicine

## 2017-09-30 ENCOUNTER — Other Ambulatory Visit: Payer: Self-pay

## 2017-09-30 ENCOUNTER — Ambulatory Visit
Admission: EM | Admit: 2017-09-30 | Discharge: 2017-09-30 | Disposition: A | Discharge status: Home / Self Care | Payer: BLUE CROSS/BLUE SHIELD | Attending: Family Medicine | Admitting: Family Medicine

## 2017-09-30 DIAGNOSIS — A084 Viral intestinal infection, unspecified: Secondary | ICD-10-CM | POA: Diagnosis not present

## 2017-09-30 MED ORDER — ONDANSETRON 8 MG PO TBDP: 8.0000 mg | ORAL_TABLET | Freq: Three times a day (TID) | ORAL | 0 refills | Status: DC | PRN

## 2017-09-30 MED ORDER — ONDANSETRON 8 MG PO TBDP
8.0000 mg | ORAL_TABLET | Freq: Once | ORAL | Status: AC
  Administered 2017-09-30: 8 mg via ORAL

## 2017-09-30 NOTE — ED Provider Notes (Signed)
[facility]    CSN: [id number] Arrival date & time: 09/30/17  1011     History   Chief Complaint Chief Complaint  Patient presents with  . Abdominal Pain    HPI Adam Hendricks is a 32 y.o. male.   32 yo male with a c/o nausea, diarrhea and abdominal pain the last 2 days associated with chills.  States today feels a little bit better and only having nausea. Last episode of diarrhea was yesterday around noon. Denies any vomiting, melena, hematochezia.   The history is provided by the patient.    Past Medical History:  Diagnosis Date  . Complication of anesthesia    PT HAD NITROUS OXIDE FOR AN ABSCESSED TOOTH AND HE STATES IT TOOK ABOUT 3 HOURS FOR BP TO COME UP  . Family history of adverse reaction to anesthesia    MOM-LOW BP WITH ANESTHESIA  . GERD (gastroesophageal reflux disease)    OCC-NO MEDS  . Headache    H/O MIGRAINES    Patient Active Problem List   Diagnosis Date Noted  . Smoker 12/08/2014  . FH: congestive heart failure 12/08/2014    Past Surgical History:  Procedure Laterality Date  . FRACTURE SURGERY  2006   RIGHT FEMUR  . MOUTH SURGERY         Home Medications    Prior to Admission medications   Medication Sig Start Date End Date Taking? Authorizing Provider  Pseudoephedrine-Naproxen Na (ALEVE-D SINUS & COLD) 120-220 MG TB12 Take by mouth.   Yes [provider]  [facility] Wort 1000 MG CAPS Take by mouth.   Yes [provider]  cyclobenzaprine (FLEXERIL) 10 MG tablet Take 1 tablet (10 mg total) by mouth 3 (three) times daily as needed for muscle spasms. 08/20/17   Tommie Sams, DO  meloxicam (MOBIC) 15 MG tablet Take 1 tablet (15 mg total) by mouth daily as needed for pain. 08/20/17   Tommie Sams, DO  ondansetron (ZOFRAN ODT) 8 MG disintegrating tablet Take 1 tablet (8 mg total) by mouth every 8 (eight) hours as needed. 09/30/17   Payton Mccallum, MD  traMADol (ULTRAM) 50 MG tablet Take 1 tablet (50 mg total) by mouth  every 8 (eight) hours as needed. 08/20/17   Tommie Sams, DO    Family History Family History  Problem Relation Age of Onset  . Heart failure Father     Social History Social History   Tobacco Use  . Smoking status: Current Every Day Smoker    Packs/day: 1.00    Years: 18.00    Pack years: 18.00    Types: Cigarettes  . Smokeless tobacco: Never Used  Substance Use Topics  . Alcohol use: Yes    Comment: occ.  . Drug use: No     Allergies   Penicillins; Bee venom; and Other   Review of Systems Review of Systems   Physical Exam Triage Vital Signs ED Triage Vitals  Enc Vitals Group     BP 09/30/17 1041 129/79     Pulse Rate 09/30/17 1041 76     Resp 09/30/17 1041 18     Temp 09/30/17 1041 98.2 F (36.8 C)     Temp Source 09/30/17 1041 Oral     SpO2 09/30/17 1041 100 %     Weight 09/30/17 1039 155 lb (70.3 kg)     Height 09/30/17 1039 5\' 10"  (1.778 m)     Head Circumference --  Peak Flow --      Pain Score 09/30/17 1038 5     Pain Loc --      Pain Edu? --      Excl. in GC? --    No data found.  Updated Vital Signs BP 129/79 (BP Location: Left Arm)   Pulse 76   Temp 98.2 F (36.8 C) (Oral)   Resp 18   Ht 5\' 10"  (1.778 m)   Wt 155 lb (70.3 kg)   SpO2 100%   BMI 22.24 kg/m   Visual Acuity Right Eye Distance:   Left Eye Distance:   Bilateral Distance:    Right Eye Near:   Left Eye Near:    Bilateral Near:     Physical Exam  Constitutional: He is oriented to person, place, and time. He appears well-developed and well-nourished. No distress.  HENT:  Head: Normocephalic and atraumatic.  Cardiovascular: Normal rate, regular rhythm, normal heart sounds and intact distal pulses.  No murmur heard. Pulmonary/Chest: Effort normal and breath sounds normal. No respiratory distress. He has no wheezes. He has no rales.  Abdominal: Soft. Bowel sounds are normal. He exhibits no distension and no mass. There is tenderness (mild, epigastric). There is no  rebound and no guarding. No hernia.  Neurological: He is alert and oriented to person, place, and time.  Skin: No rash noted. He is not diaphoretic.  Nursing note and vitals reviewed.    UC Treatments / Results  Labs (all labs ordered are listed, but only abnormal results are displayed) Labs Reviewed - No data to display  EKG  EKG Interpretation None       Radiology No results found.  Procedures Procedures (including critical care time)  Medications Ordered in UC Medications  ondansetron (ZOFRAN-ODT) disintegrating tablet 8 mg (8 mg Oral Given 09/30/17 1114)     Initial Impression / Assessment and Plan / UC Course  I have reviewed the triage vital signs and the nursing notes.  Pertinent labs & imaging results that were available during my care of the patient were reviewed by me and considered in my medical decision making (see chart for details).       Final Clinical Impressions(s) / UC Diagnoses   Final diagnoses:  Viral gastroenteritis    ED Discharge Orders        Ordered    ondansetron (ZOFRAN ODT) 8 MG disintegrating tablet  Every 8 hours PRN     09/30/17 1110     1. diagnosis reviewed with patient 2. rx as per orders above; reviewed possible side effects, interactions, risks and benefits  3. Recommend supportive treatment with rest, clear liquids then advance diet slowly as tolerated; otc imodium AD 4. Follow-up prn if symptoms worsen or don't improve  Controlled Substance Prescriptions [place] Controlled Substance Registry consulted? Not Applicable   Payton Mccallumonty, Birl Lobello, MD 09/30/17 1227

## 2017-09-30 NOTE — ED Triage Notes (Signed)
Patient complains of nausea, fever, back pain and abdominal pain that comes and goes. Patient states that this started Sunday pm.

## 2017-10-03 ENCOUNTER — Telehealth: Payer: Self-pay | Admitting: Emergency Medicine

## 2017-10-03 NOTE — Telephone Encounter (Signed)
Called to follow up after patient's recent visit. Patient states he is feeling better and returned to work yesterday.

## 2018-02-01 ENCOUNTER — Ambulatory Visit
Admission: EM | Admit: 2018-02-01 | Discharge: 2018-02-01 | Disposition: A | Discharge status: Home / Self Care | Payer: BLUE CROSS/BLUE SHIELD | Attending: Internal Medicine | Admitting: Internal Medicine

## 2018-02-01 DIAGNOSIS — S0501XA Injury of conjunctiva and corneal abrasion without foreign body, right eye, initial encounter: Secondary | ICD-10-CM

## 2018-02-01 MED ORDER — ERYTHROMYCIN 5 MG/GM OP OINT: TOPICAL_OINTMENT | OPHTHALMIC | 0 refills | Status: DC

## 2018-02-01 NOTE — ED Provider Notes (Signed)
[facility]    CSN: [id number] Arrival date & time: 02/01/18  1107     History   Chief Complaint Chief Complaint  Patient presents with  . Eye Problem    HPI Adam Hendricks is a 32 y.o. male who presents today for evaluation of a 48-hour history of right eye pain and irritation.  Patient states that he woke up yesterday morning with increased redness to the right eye and discomfort, he did not notice any drainage.  He purchase over-the-counter pinkeye medication and has been taking this but has not noticed any improvement.  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  He does state that he wears eye protection whenever he is working.  He does have a history of trauma to the left eye several years ago, at the time he was working got metal in his eye and had to undergo a surgical procedure.  He states that his vision is "fuzzy" but denies any complete loss of vision.  He denies any drainage.  HPI  Past Medical History:  Diagnosis Date  . Complication of anesthesia    PT HAD NITROUS OXIDE FOR AN ABSCESSED TOOTH AND HE STATES IT TOOK ABOUT 3 HOURS FOR BP TO COME UP  . Family history of adverse reaction to anesthesia    MOM-LOW BP WITH ANESTHESIA  . GERD (gastroesophageal reflux disease)    OCC-NO MEDS  . Headache    H/O MIGRAINES    Patient Active Problem List   Diagnosis Date Noted  . Smoker 12/08/2014  . FH: congestive heart failure 12/08/2014    Past Surgical History:  Procedure Laterality Date  . FRACTURE SURGERY  2006   RIGHT FEMUR  . MOUTH SURGERY         Home Medications    Prior to Admission medications   Medication Sig Start Date End Date Taking? Authorizing Provider  cyclobenzaprine (FLEXERIL) 10 MG tablet Take 1 tablet (10 mg total) by mouth 3 (three) times daily as needed for muscle spasms. 08/20/17   Tommie Samsook, Jayce G, DO  erythromycin ophthalmic  ointment Place a 1/2 inch ribbon of ointment into the lower eyelid 3 times daily. 02/01/18   Anson OregonMcGhee, Kavion Lance, PA-C  meloxicam (MOBIC) 15 MG tablet Take 1 tablet (15 mg total) by mouth daily as needed for pain. 08/20/17   Tommie Samsook, Jayce G, DO  ondansetron (ZOFRAN ODT) 8 MG disintegrating tablet Take 1 tablet (8 mg total) by mouth every 8 (eight) hours as needed. 09/30/17   Payton Mccallumonty, Orlando, MD  Pseudoephedrine-Naproxen Na (ALEVE-D SINUS & COLD) 120-220 MG TB12 Take by mouth.    [provider]  [facility] Wort 1000 MG CAPS Take by mouth.    [provider]  traMADol (ULTRAM) 50 MG tablet Take 1 tablet (50 mg total) by mouth every 8 (eight) hours as needed. 08/20/17   Tommie Samsook, Jayce G, DO    Family History Family History  Problem Relation Age of Onset  . Heart failure Father     Social History Social History   Tobacco Use  . Smoking status: Current Every Day Smoker    Packs/day: 1.00    Years: 18.00    Pack years: 18.00    Types: Cigarettes  . Smokeless tobacco: Never Used  Substance Use Topics  . Alcohol use: Yes    Comment: occ.  . Drug use:  No     Allergies   Penicillins; Bee venom; and Other   Review of Systems Review of Systems  Constitutional: Negative.   HENT: Negative.   Eyes: Positive for photophobia, pain, redness and visual disturbance.  Respiratory: Negative.   Cardiovascular: Negative.   Gastrointestinal: Negative.   Endocrine: Negative.   Genitourinary: Negative.   Musculoskeletal: Negative.   Allergic/Immunologic: Negative.   Neurological: Negative.   Hematological: Negative.   Psychiatric/Behavioral: Negative.    Physical Exam Triage Vital Signs ED Triage Vitals  Enc Vitals Group     BP 02/01/18 1124 118/83     Pulse Rate 02/01/18 1124 72     Resp 02/01/18 1124 18     Temp 02/01/18 1124 98 F (36.7 C)     Temp Source 02/01/18 1124 Oral     SpO2 02/01/18 1124 99 %     Weight --      Height --      Head Circumference --      Peak Flow  --      Pain Score 02/01/18 1126 4     Pain Loc --      Pain Edu? --      Excl. in GC? --    No data found.  Updated Vital Signs BP 118/83 (BP Location: Left Arm)   Pulse 72   Temp 98 F (36.7 C) (Oral)   Resp 18   SpO2 99%   Visual Acuity Right Eye Distance: 20/70 Left Eye Distance: 20/13 Bilateral Distance: 20/25  Right Eye Near:   Left Eye Near:    Bilateral Near:     Physical Exam  Constitutional: He appears well-developed and well-nourished. He is active. No distress.  Eyes: Pupils are equal, round, and reactive to light. EOM and lids are normal. Lids are everted and swept, no foreign bodies found. Right conjunctiva is injected. Left conjunctiva is not injected. Left conjunctiva has no hemorrhage.  Slit lamp exam:      The right eye shows corneal abrasion. The right eye shows no foreign body.    Cardiovascular: S1 normal, S2 normal and normal heart sounds.  Neurological: He is alert.  Skin: He is not diaphoretic.   UC Treatments / Results  Labs (all labs ordered are listed, but only abnormal results are displayed) Labs Reviewed - No data to display  EKG None  Radiology No results found.  Procedures Procedures (including critical care time)  Medications Ordered in UC Medications - No data to display  Initial Impression / Assessment and Plan / UC Course  I have reviewed the triage vital signs and the nursing notes.  Pertinent labs & imaging results that were available during my care of the patient were reviewed by me and considered in my medical decision making (see chart for details).     1.  Treatment options were discussed today with the patient.   2.  On Woods lamp exam there is a small corneal abrasion present to the right eye. 3.  Antibiotics prescribed for the patient, instructed the patient to continue taking either ibuprofen or Tylenol as needed for discomfort. 4.  Wear eye patch over next 24 hours. 5.  Follow-up if no improvement in 48-72  hours.  Final Clinical Impressions(s) / UC Diagnoses   Final diagnoses:  Abrasion of right cornea, initial encounter     Discharge Instructions     -Take antibiotic ointment as prescribed. -Purchase gauze from pharmacy and wear as eye patch over the next 24  hours. -Call if no improvement in 3 days for referral to opthalmology.    ED Prescriptions    Medication Sig Dispense Auth. Provider   erythromycin ophthalmic ointment Place a 1/2 inch ribbon of ointment into the lower eyelid 3 times daily. 1 g Anson Oregon, PA-C     Controlled Substance Prescriptions [place] Controlled Substance Registry consulted? Not Applicable   Hovanes, Hymas, PA-C 02/01/18 1205

## 2018-02-01 NOTE — Discharge Instructions (Addendum)
-  Take antibiotic ointment as prescribed. -Purchase gauze from pharmacy and wear as eye patch over the next 24 hours. -Call if no improvement in 3 days for referral to opthalmology.

## 2018-02-01 NOTE — ED Triage Notes (Signed)
Pt here for right eye problem. Said he did try pink eye otc meds without relief. It is red, no itching and said his vision is blurry when he closes his left eye. Does have glasses and states he finds it difficult to drive and causing some dizziness.

## 2018-04-24 ENCOUNTER — Other Ambulatory Visit: Payer: Self-pay

## 2018-04-24 ENCOUNTER — Encounter: Payer: Self-pay | Admitting: Emergency Medicine

## 2018-04-24 ENCOUNTER — Ambulatory Visit (INDEPENDENT_AMBULATORY_CARE_PROVIDER_SITE_OTHER): Payer: BLUE CROSS/BLUE SHIELD

## 2018-04-24 ENCOUNTER — Ambulatory Visit
Admission: EM | Admit: 2018-04-24 | Discharge: 2018-04-24 | Disposition: A | Discharge status: Home / Self Care | Payer: BLUE CROSS/BLUE SHIELD | Attending: Emergency Medicine | Admitting: Emergency Medicine

## 2018-04-24 DIAGNOSIS — M542 Cervicalgia: Secondary | ICD-10-CM

## 2018-04-24 DIAGNOSIS — M5412 Radiculopathy, cervical region: Secondary | ICD-10-CM

## 2018-04-24 MED ORDER — IBUPROFEN 600 MG PO TABS: 600.0000 mg | ORAL_TABLET | Freq: Four times a day (QID) | ORAL | 0 refills | Status: AC | PRN

## 2018-04-24 MED ORDER — METHOCARBAMOL 750 MG PO TABS: 750.0000 mg | ORAL_TABLET | ORAL | 0 refills | Status: AC

## 2018-04-24 MED ORDER — METHYLPREDNISOLONE 4 MG PO TBPK: ORAL_TABLET | Freq: Every day | ORAL | 0 refills | Status: AC

## 2018-04-24 NOTE — ED Triage Notes (Signed)
Patient has taken his wife's muscle relaxer to try and get relief.
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
## 2018-04-24 NOTE — Discharge Instructions (Addendum)
Take 1 Robaxin every 4 hours or you may take 2 Robaxin 3 times a day.  600 mg of ibuprofen combined with 1 g of Tylenol.  Take this together as needed for pain.  Follow-up with orthopedic surgery or neurosurgery soon as you possibly can your neck.  You will need to see orthopedic surgery for your knee.  Here is a list of primary care providers who are taking new patients:  Dr. Elizabeth Sauer, Dr. Schuyler Amor [address] [place] [address] [phone]  [facility] [address] [place] [address]  [phone]  [facility] [address] [place], [place] [address] [phone]  [facility] [address] [place]  [phone] [place], [place] [address]  Here are clinics/ other resources who will see you if you do not have insurance. Some have certain criteria that you must meet. Call them and find out what they are:  [facility]: [address], [place], [place] [address] Phone: [phone] Hours: First and Third Saturdays of each Month, 9 a.m. - 1 p.m.  [facility]: [address] [place], [place] [address] Phone: [phone] Hours: Tuesday, 4 p.m. - 8 p.m. Thursday, 1 p.m. - 8 p.m. Wednesday, 9 a.m. - [facility] [address] [place], [place] [address] Phone: [phone] Pharmacy Phone Number: [phone] Dental Phone Number: [phone] [facility] Insurance Help: [phone]  Dental Hours: Monday - Thursday, 8 a.m. - 6 p.m.  [facility] [address] [place], [place] [address] Phone: [phone] Pharmacy Phone Number: [phone] [facility] Insurance Help: [phone]  [facility] [address] [place] [place], [place] [address] Phone: [phone] Pharmacy Phone Number: [phone] [facility] Insurance Help: [phone]  [facility] [address] [place], [place] [address] Phone: [phone] [facility] Insurance Help:  [phone]   [facility] [address], [place], [place] [address] Phone: [phone]  Go to www.goodrx.com to look up your medications. This will give you a list of where you can find your prescriptions at the most affordable prices. Or ask the pharmacist what the cash price is, or if they have any other discount programs available to help make your medication more affordable. This can be less expensive than what you would pay with insurance.

## 2018-12-27 ENCOUNTER — Other Ambulatory Visit: Payer: Self-pay

## 2018-12-27 ENCOUNTER — Encounter: Payer: Self-pay | Admitting: Emergency Medicine

## 2018-12-27 ENCOUNTER — Emergency Department
Admission: EM | Admit: 2018-12-27 | Discharge: 2018-12-27 | Disposition: A | Discharge status: Home / Self Care | Payer: BLUE CROSS/BLUE SHIELD | Attending: Emergency Medicine | Admitting: Emergency Medicine

## 2018-12-27 DIAGNOSIS — M273 Alveolitis of jaws: Secondary | ICD-10-CM | POA: Diagnosis not present

## 2018-12-27 DIAGNOSIS — G8918 Other acute postprocedural pain: Secondary | ICD-10-CM | POA: Diagnosis not present

## 2018-12-27 DIAGNOSIS — F17210 Nicotine dependence, cigarettes, uncomplicated: Secondary | ICD-10-CM | POA: Insufficient documentation

## 2018-12-27 LAB — CBC WITH DIFFERENTIAL/PLATELET
Abs Immature Granulocytes: 0.04 10*3/uL (ref 0.00–0.07)
Basophils Absolute: 0.1 10*3/uL (ref 0.0–0.1)
Basophils Relative: 1 %
Eosinophils Absolute: 0.2 10*3/uL (ref 0.0–0.5)
Eosinophils Relative: 2 %
HCT: 41.8 % (ref 39.0–52.0)
Hemoglobin: 14.6 g/dL (ref 13.0–17.0)
Immature Granulocytes: 1 %
Lymphocytes Relative: 27 %
Lymphs Abs: 2.1 10*3/uL (ref 0.7–4.0)
MCH: 30.7 pg (ref 26.0–34.0)
MCHC: 34.9 g/dL (ref 30.0–36.0)
MCV: 87.8 fL (ref 80.0–100.0)
Monocytes Absolute: 0.6 10*3/uL (ref 0.1–1.0)
Monocytes Relative: 8 %
Neutro Abs: 4.8 10*3/uL (ref 1.7–7.7)
Neutrophils Relative %: 61 %
Platelets: 294 10*3/uL (ref 150–400)
RBC: 4.76 MIL/uL (ref 4.22–5.81)
RDW: 11.7 % (ref 11.5–15.5)
WBC: 7.8 10*3/uL (ref 4.0–10.5)
nRBC: 0 % (ref 0.0–0.2)

## 2018-12-27 LAB — BASIC METABOLIC PANEL
Anion gap: 8 (ref 5–15)
BUN: 14 mg/dL (ref 6–20)
CO2: 25 mmol/L (ref 22–32)
Calcium: 9.1 mg/dL (ref 8.9–10.3)
Chloride: 107 mmol/L (ref 98–111)
Creatinine, Ser: 0.79 mg/dL (ref 0.61–1.24)
GFR calc Af Amer: >60 mL/min (ref >60)
GFR calc non Af Amer: >60 mL/min (ref >60)
Glucose, Bld: 123 mg/dL — ABNORMAL HIGH (ref 70–99)
Potassium: 4.2 mmol/L (ref 3.5–5.1)
Sodium: 140 mmol/L (ref 135–145)

## 2018-12-27 MED ORDER — LIDOCAINE VISCOUS HCL 2 % MT SOLN: 15.0000 mL | OROMUCOSAL | 0 refills | Status: AC | PRN

## 2018-12-27 MED ORDER — LIDOCAINE VISCOUS HCL 2 % MT SOLN
15.0000 mL | Freq: Once | OROMUCOSAL | Status: AC
  Administered 2018-12-27: 15 mL via OROMUCOSAL
  Filled 2018-12-27: qty 15

## 2018-12-27 MED ORDER — OXYCODONE-ACETAMINOPHEN 5-325 MG PO TABS: 1.0000 | ORAL_TABLET | Freq: Three times a day (TID) | ORAL | 0 refills | Status: AC | PRN

## 2018-12-27 MED ORDER — OXYCODONE-ACETAMINOPHEN 5-325 MG PO TABS
2.0000 | ORAL_TABLET | Freq: Once | ORAL | Status: AC
  Administered 2018-12-27: 2 via ORAL
  Filled 2018-12-27: qty 2

## 2018-12-27 NOTE — ED Triage Notes (Signed)
Pt. C/o pain after dental surgery Tuesday. Had 3 extractions and 3 bone grafts on right lower jaw. C/O chills, nausea, fatigue, bad taste in mouth, states 20/10 pain. Nothing to eat or drink today, has been on clindamycin since surgery. Took antibiotic 1:15 pm today.

## 2018-12-27 NOTE — ED Notes (Signed)
This RN spoke with Marisue Humble, PA-C regarding patient care. See orders for details.

## 2018-12-27 NOTE — ED Provider Notes (Signed)
[facility] Emergency Department Provider Note       Time seen: ----------------------------------------- 5:59 PM on 12/27/2018 -----------------------------------------   I have reviewed the triage vital signs and the nursing notes.  HISTORY   Chief Complaint Post-op Problem    HPI Adam Hendricks is a 33 y.o. male with a history of headaches, GERD who presents to the ED for pain after dental surgery on Tuesday.  Patient had 3 extraction and 3 bone grafts in his right lower jaw.  He has chills, nausea, fatigue and a bad taste in his mouth.  He has not been able to eat or drink anything today.  He has been on clindamycin since the surgery.  Past Medical History:  Diagnosis Date  . Complication of anesthesia    PT HAD NITROUS OXIDE FOR AN ABSCESSED TOOTH AND HE STATES IT TOOK ABOUT 3 HOURS FOR BP TO COME UP  . Family history of adverse reaction to anesthesia    MOM-LOW BP WITH ANESTHESIA  . GERD (gastroesophageal reflux disease)    OCC-NO MEDS  . Headache    H/O MIGRAINES    Patient Active Problem List   Diagnosis Date Noted  . Smoker 12/08/2014  . FH: congestive heart failure 12/08/2014    Past Surgical History:  Procedure Laterality Date  . FRACTURE SURGERY  2006   RIGHT FEMUR  . MOUTH SURGERY      Allergies Penicillins; Bee venom; and Other  Social History Social History   Tobacco Use  . Smoking status: Current Every Day Smoker    Packs/day: 1.00    Years: 18.00    Pack years: 18.00    Types: Cigarettes  . Smokeless tobacco: Never Used  Substance Use Topics  . Alcohol use: Yes    Comment: occ.  . Drug use: No   Review of Systems Constitutional: Negative for fever.  Positive for chills HEENT: Positive for dental pain and jaw pain Cardiovascular: Negative for chest pain. Respiratory: Negative for shortness of breath. Gastrointestinal: Negative for abdominal pain, vomiting and diarrhea. Skin: Negative for rash. Neurological:  Negative for headaches, focal weakness or numbness.  All systems negative/normal/unremarkable except as stated in the HPI  ____________________________________________   PHYSICAL EXAM:  VITAL SIGNS: ED Triage Vitals  Enc Vitals Group     BP 12/27/18 1651 137/76     Pulse Rate 12/27/18 1651 74     Resp 12/27/18 1651 18     Temp 12/27/18 1651 98.2 F (36.8 C)     Temp Source 12/27/18 1651 Oral     SpO2 12/27/18 1651 99 %     Weight 12/27/18 1653 155 lb (70.3 kg)     Height 12/27/18 1653 5\' 10"  (1.778 m)     Head Circumference --      Peak Flow --      Pain Score 12/27/18 1652 5     Pain Loc --      Pain Edu? --      Excl. in GC? --    Constitutional: Alert and oriented. Well appearing and in no distress. ENT      Head: Normocephalic and atraumatic.      Nose: No congestion/rhinnorhea.      Mouth/Throat: There are dental caries throughout all remaining teeth on the lower jaw, previous dental extraction noted on the right lower jaw with obvious dry socket formation      Neck: No stridor. Cardiovascular: Normal rate, regular rhythm. No murmurs, rubs, or gallops. Respiratory: Normal  respiratory effort without tachypnea nor retractions. Breath sounds are clear and equal bilaterally. No wheezes/rales/rhonchi. Musculoskeletal: Nontender with normal range of motion in extremities. No lower extremity tenderness nor edema. ____________________________________________  ED COURSE:  As part of my medical decision making, I reviewed the following data within the electronic MEDICAL RECORD NUMBER History obtained from family if available, nursing notes, old chart and ekg, as well as notes from prior ED visits. Patient presented for dental pain, we will assess with labs and imaging as indicated at this time.   Procedures  Adam Hendricks was evaluated in Emergency Department on 12/27/2018 for the symptoms described in the history of present illness. He was evaluated in the context of the global  COVID-19 pandemic, which necessitated consideration that the patient might be at risk for infection with the SARS-CoV-2 virus that causes COVID-19. Institutional protocols and algorithms that pertain to the evaluation of patients at risk for COVID-19 are in a state of rapid change based on information released by regulatory bodies including the CDC and federal and state organizations. These policies and algorithms were followed during the patient's care in the ED.  ____________________________________________   LABS (pertinent positives/negatives)  Labs Reviewed  BASIC METABOLIC PANEL - Abnormal; Notable for the following components:      Result Value   Glucose, Bld 123 (*)    All other components within normal limits  CBC WITH DIFFERENTIAL/PLATELET  ____________________________________________   DIFFERENTIAL DIAGNOSIS   Dry socket, osteomyelitis, postoperative pain, dental caries, dental abscess, dehydration  FINAL ASSESSMENT AND PLAN  Postoperative pain, dry socket   Plan: The patient had presented for postoperative pain from dental procedure. Patient's labs are reassuring.  We did not have any dry socket paste, we applied viscous lidocaine and gave him some pain medicine until he can see his oral surgeon tomorrow.   Adam DashJohnathan E Jaclyn Andy, MD    Note: This note was generated in part or whole with voice recognition software. Voice recognition is usually quite accurate but there are transcription errors that can and very often do occur. I apologize for any typographical errors that were not detected and corrected.     Emily FilbertWilliams, [facility] Reidinger E, MD 12/27/18 [phone]

## 2019-11-10 ENCOUNTER — Other Ambulatory Visit: Payer: Self-pay

## 2019-11-10 ENCOUNTER — Ambulatory Visit
Admission: EM | Admit: 2019-11-10 | Discharge: 2019-11-10 | Disposition: A | Discharge status: Home / Self Care | Payer: 59 | Attending: Family Medicine | Admitting: Family Medicine

## 2019-11-10 ENCOUNTER — Encounter: Payer: Self-pay | Admitting: Emergency Medicine

## 2019-11-10 DIAGNOSIS — T1592XA Foreign body on external eye, part unspecified, left eye, initial encounter: Secondary | ICD-10-CM

## 2019-11-10 NOTE — ED Provider Notes (Signed)
[facility]    CSN: [id number] Arrival date & time: 11/10/19  0801      History   Chief Complaint Chief Complaint  Patient presents with  . Eye Problem   HPI  34 year old male presents with a foreign body in his left eye.  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  He states that he got something in his left eye.  He states that he is unsure of what the foreign body is.  He states that it may be metal.  He has flushed the eye without resolution.  He has been unable to get the foreign body out of his eye.  Rates his pain as 7/10 in severity.  No relieving factors.  No visual disturbance.  No other associated symptoms.  No other complaints.  Past Medical History:  Diagnosis Date  . Complication of anesthesia    PT HAD NITROUS OXIDE FOR AN ABSCESSED TOOTH AND HE STATES IT TOOK ABOUT 3 HOURS FOR BP TO COME UP  . Family history of adverse reaction to anesthesia    MOM-LOW BP WITH ANESTHESIA  . GERD (gastroesophageal reflux disease)    OCC-NO MEDS  . Headache    H/O MIGRAINES    Patient Active Problem List   Diagnosis Date Noted  . Smoker 12/08/2014  . FH: congestive heart failure 12/08/2014    Past Surgical History:  Procedure Laterality Date  . FRACTURE SURGERY  2006   RIGHT FEMUR  . MOUTH SURGERY     Home Medications    Prior to Admission medications   Medication Sig Start Date End Date Taking? Authorizing Provider  ibuprofen (ADVIL,MOTRIN) 600 MG tablet Take 1 tablet (600 mg total) by mouth every 6 (six) hours as needed. 04/24/18   Melynda Ripple, MD  lidocaine (XYLOCAINE) 2 % solution Use as directed 15 mLs in the mouth or throat as needed for mouth pain. 12/27/18   Earleen Newport, MD  methocarbamol (ROBAXIN) 750 MG tablet Take 1 tablet (750 mg total) by mouth every 4 (four) hours. 04/24/18   Melynda Ripple, MD  methylPREDNISolone (MEDROL DOSEPAK) 4 MG TBPK tablet Take by mouth daily. Follow package instructions 04/24/18    Melynda Ripple, MD  oxyCODONE-acetaminophen (PERCOCET) 5-325 MG tablet Take 1 tablet by mouth every 8 (eight) hours as needed. 12/27/18   Earleen Newport, MD    Family History Family History  Problem Relation Age of Onset  . Healthy Mother   . Heart failure Father   . COPD Paternal Grandmother     Social History Social History   Tobacco Use  . Smoking status: Current Every Day Smoker    Packs/day: 1.00    Years: 18.00    Pack years: 18.00    Types: Cigarettes  . Smokeless tobacco: Never Used  Substance Use Topics  . Alcohol use: Yes    Comment: occ.  . Drug use: No     Allergies   Penicillins, Bee venom, and Other   Review of Systems Review of Systems  Constitutional: Negative.   Eyes: Positive for pain. Negative for visual disturbance.       Foreign body, left eye.   Physical Exam Triage Vital Signs ED Triage Vitals  Enc Vitals Group     BP 11/10/19 0815 128/77     Pulse Rate 11/10/19 0815 78     Resp 11/10/19 0815 18     Temp 11/10/19 0815 97.9 F (36.6 C)     Temp  Source 11/10/19 0815 Oral     SpO2 11/10/19 0815 97 %     Weight 11/10/19 0814 150 lb (68 kg)     Height 11/10/19 0814 5\' 10"  (1.778 m)     Head Circumference --      Peak Flow --      Pain Score 11/10/19 0814 7     Pain Loc --      Pain Edu? --      Excl. in GC? --    Updated Vital Signs BP 128/77 (BP Location: Right Arm)   Pulse 78   Temp 97.9 F (36.6 C) (Oral)   Resp 18   Ht 5\' 10"  (1.778 m)   Wt 68 kg   SpO2 97%   BMI 21.52 kg/m   Visual Acuity Right Eye Distance:   Left Eye Distance:   Bilateral Distance:    Right Eye Near:   Left Eye Near:    Bilateral Near:     Physical Exam Vitals and nursing note reviewed.  Constitutional:      General: He is not in acute distress.    Appearance: Normal appearance. He is not ill-appearing.  HENT:     Nose: Nose normal.  Eyes:     General:        Right eye: No discharge.        Left eye: No discharge.      Conjunctiva/sclera: Conjunctivae normal.      Comments: Metallic foreign body noted at labelled location.   Pulmonary:     Effort: Pulmonary effort is normal. No respiratory distress.  Neurological:     Mental Status: He is alert.  Psychiatric:        Mood and Affect: Mood normal.        Behavior: Behavior normal.    UC Treatments / Results  Labs (all labs ordered are listed, but only abnormal results are displayed) Labs Reviewed - No data to display  EKG   Radiology No results found.  Procedures Procedures (including critical care time)  Medications Ordered in UC Medications - No data to display  Initial Impression / Assessment and Plan / UC Course  I have reviewed the triage vital signs and the nursing notes.  Pertinent labs & imaging results that were available during my care of the patient were reviewed by me and considered in my medical decision making (see chart for details).    34 year old male presents with a foreign body in his left eye.  I attempted to remove the foreign body today and was unsuccessful.  Patient going urgently to [facility] for removal.  I called the office and they have agreed to see him this morning.  Patient is going straight there.  Final Clinical Impressions(s) / UC Diagnoses   Final diagnoses:  Foreign body, eye, left, initial encounter     Discharge Instructions     Go straight to [facility]; they are expecting you.  [address], [place], [address] [place]  Take care  Dr. [place]    ED Prescriptions    None     PDMP not reviewed this encounter.   [id number], Adriana Simas 11/10/19 [phone]

## 2019-11-10 NOTE — ED Triage Notes (Signed)
Patient states he got something in his left eye that happened yesterday. He is unsure of what got into his eye.

## 2019-11-10 NOTE — Discharge Instructions (Signed)
Go straight to [facility]; they are expecting you.  [address], [place], [place] [address]  Take care  Dr. Adriana Simas

## 2020-05-25 IMAGING — CR DG CERVICAL SPINE COMPLETE 4+V
5 series · 5 of 5 positions shown · non-contrast
Comparison: August 20, 2017

CLINICAL DATA: Cervicalgia with upper extremity radicular type
symptoms

EXAM:
CERVICAL SPINE - COMPLETE 4+ VIEW

[c-spine lat]
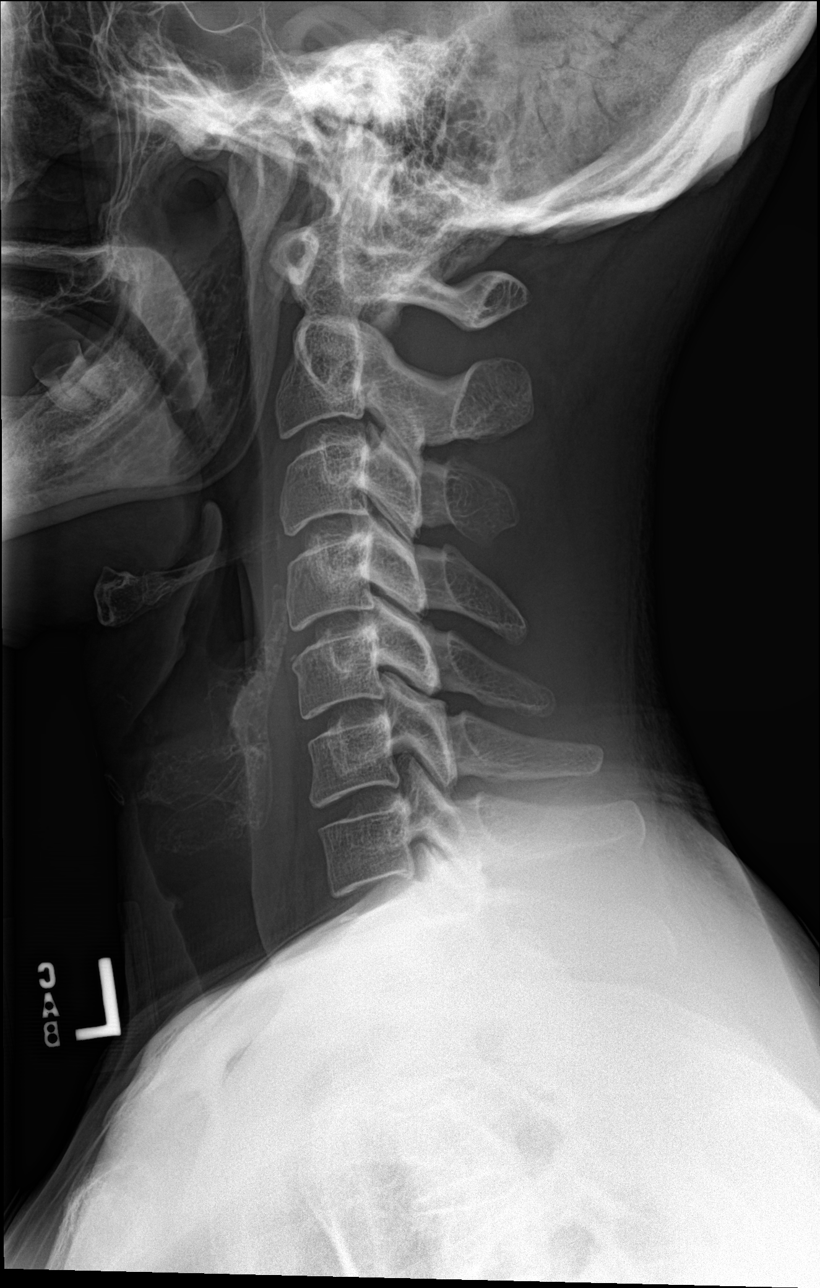

[c-spine obl (1 of 2)]
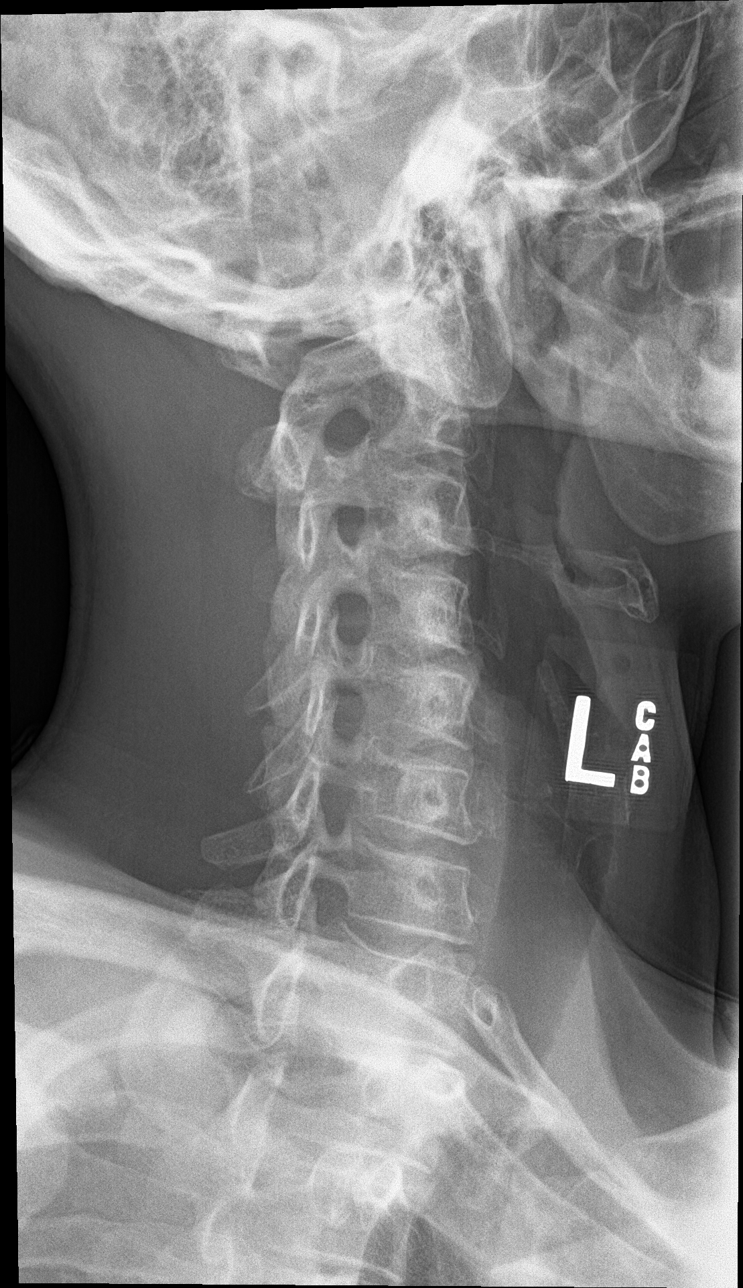

[c-spine obl (2 of 2)]
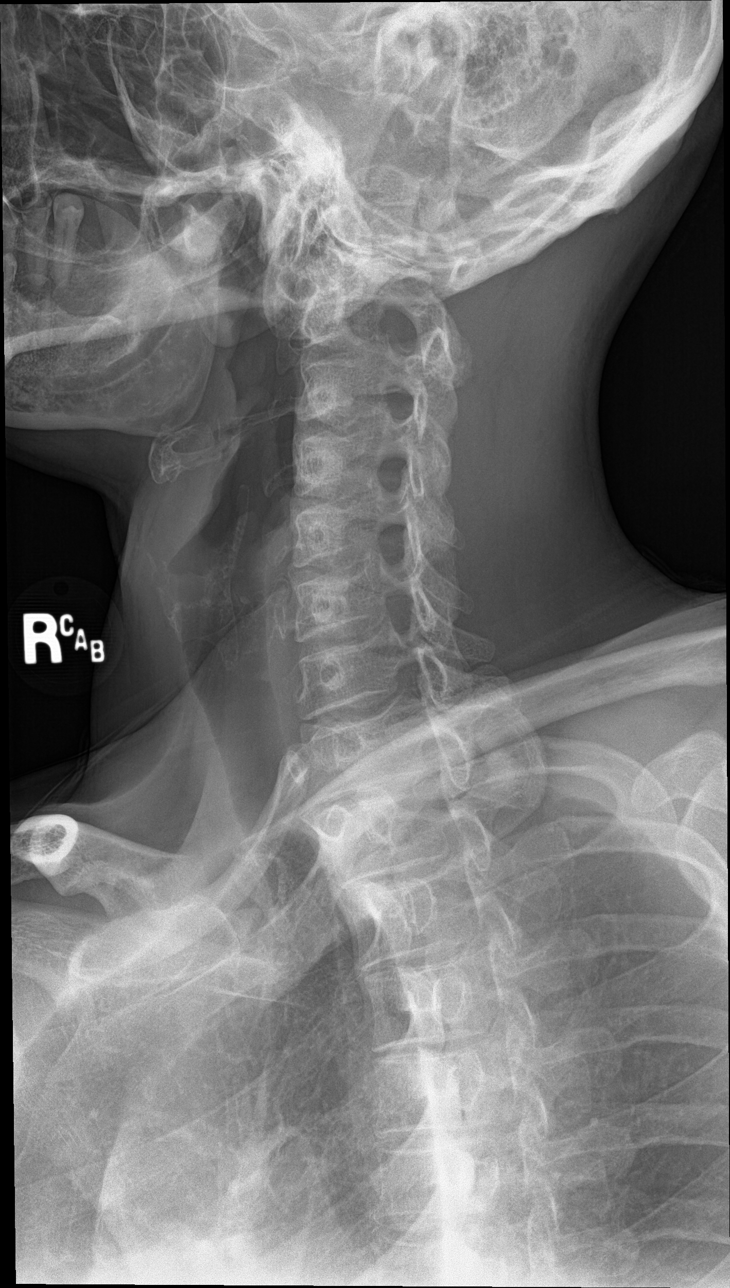

[c-spine ap]
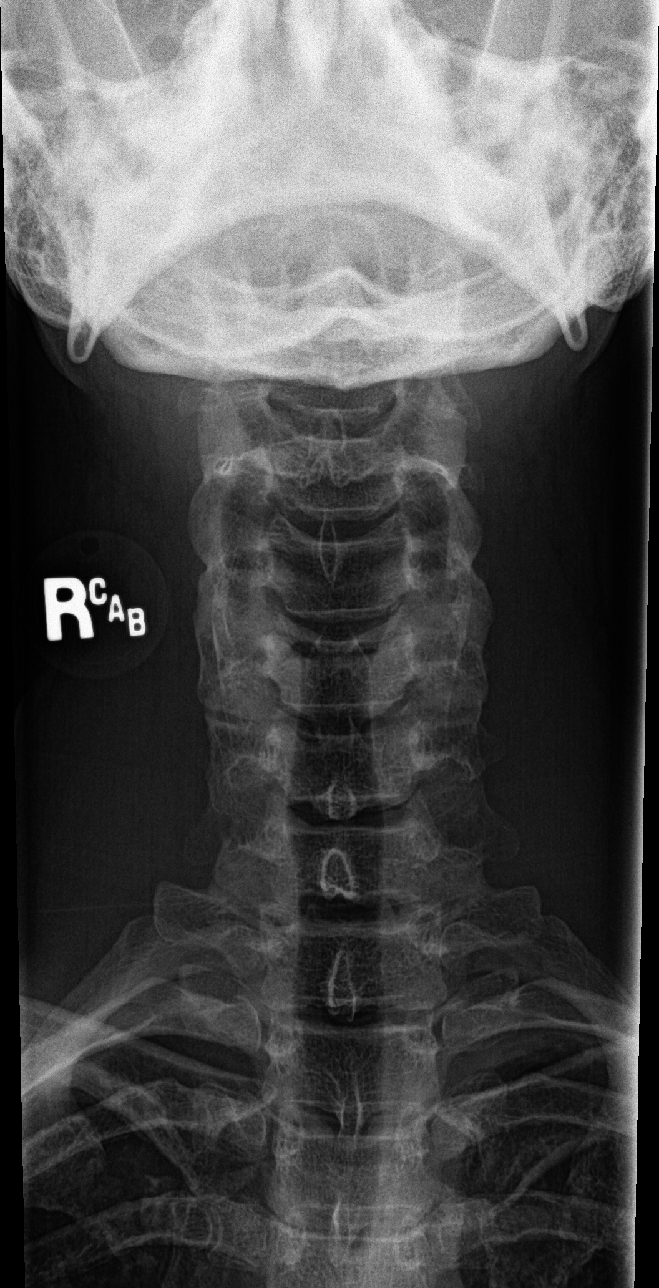

[c-spine open mouth]
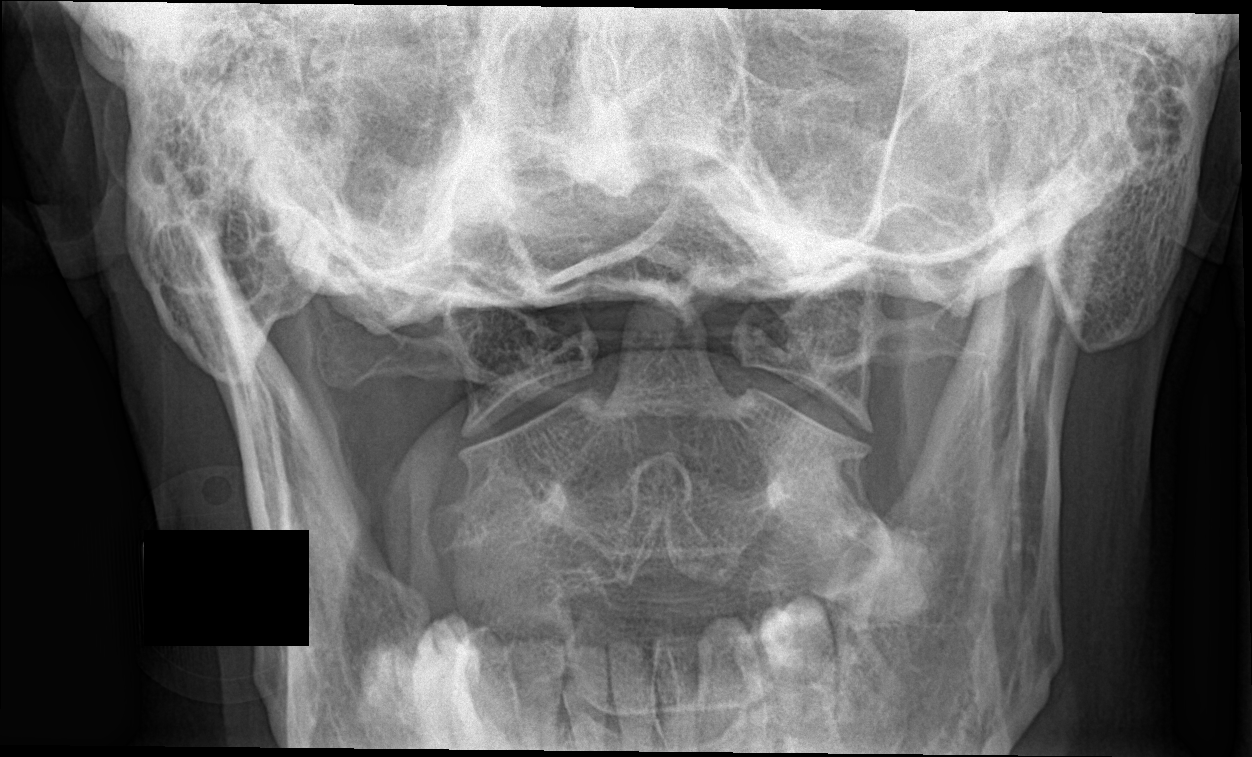

[5 of 5 positions shown; findings below may reference images not displayed]

FINDINGS: Frontal, lateral, open-mouth odontoid, and bilateral oblique views
were obtained. There is no fracture or spondylolisthesis.
Prevertebral soft tissues and predental space regions are normal.
Disc spaces appear normal. There is no appreciable exit foraminal
narrowing on the oblique views. Lung apices are clear.
IMPRESSION: No fracture or spondylolisthesis.  No evident arthropathy.

## 2021-09-22 ENCOUNTER — Ambulatory Visit
Admission: EM | Admit: 2021-09-22 | Discharge: 2021-09-22 | Disposition: A | Discharge status: Home / Self Care | Payer: Managed Care, Other (non HMO)

## 2021-09-22 ENCOUNTER — Encounter: Payer: Self-pay | Admitting: Emergency Medicine

## 2021-09-22 ENCOUNTER — Other Ambulatory Visit: Payer: Self-pay

## 2021-09-22 DIAGNOSIS — L0291 Cutaneous abscess, unspecified: Secondary | ICD-10-CM | POA: Diagnosis not present

## 2021-09-22 MED ORDER — KETOROLAC TROMETHAMINE 60 MG/2ML IM SOLN
60.0000 mg | Freq: Once | INTRAMUSCULAR | Status: AC
  Administered 2021-09-22: 60 mg via INTRAMUSCULAR

## 2021-09-22 MED ORDER — DOXYCYCLINE HYCLATE 100 MG PO CAPS: 100.0000 mg | ORAL_CAPSULE | Freq: Two times a day (BID) | ORAL | 0 refills | Status: AC

## 2021-09-22 NOTE — ED Provider Notes (Signed)
[facility]    CSN: [id number] Arrival date & time: 09/22/21  0948      History   Chief Complaint Chief Complaint  Patient presents with   Abscess    Left ear    HPI Adam Hendricks is a 36 y.o. male.   HPI  Abscess: Pt reports that he has had a small bump behind his left ear lobe for the past few months.  A few days ago it became inflamed.  He states that he tried to poke it with a needle and since that time he has had worsening swelling and pain of the area. No fevers, vomiting or history of MRSA. He has tried tylenol for pain.   Past Medical History:  Diagnosis Date   Complication of anesthesia    PT HAD NITROUS OXIDE FOR AN ABSCESSED TOOTH AND HE STATES IT TOOK ABOUT 3 HOURS FOR BP TO COME UP   Family history of adverse reaction to anesthesia    MOM-LOW BP WITH ANESTHESIA   GERD (gastroesophageal reflux disease)    OCC-NO MEDS   Headache    H/O MIGRAINES    Patient Active Problem List   Diagnosis Date Noted   Smoker 12/08/2014   FH: congestive heart failure 12/08/2014    Past Surgical History:  Procedure Laterality Date   FRACTURE SURGERY  2006   RIGHT FEMUR   MOUTH SURGERY         Home Medications    Prior to Admission medications   Medication Sig Start Date End Date Taking? Authorizing Provider  buPROPion (WELLBUTRIN XL) 300 MG 24 hr tablet Take 300 mg by mouth daily. 06/26/21   [provider]  ibuprofen (ADVIL,MOTRIN) 600 MG tablet Take 1 tablet (600 mg total) by mouth every 6 (six) hours as needed. 04/24/18   Domenick Gong, MD  lidocaine (XYLOCAINE) 2 % solution Use as directed 15 mLs in the mouth or throat as needed for mouth pain. 12/27/18   Emily Filbert, MD  methocarbamol (ROBAXIN) 750 MG tablet Take 1 tablet (750 mg total) by mouth every 4 (four) hours. 04/24/18   Domenick Gong, MD  methylPREDNISolone (MEDROL DOSEPAK) 4 MG TBPK tablet Take by mouth daily. Follow package instructions 04/24/18   Domenick Gong, MD   oxyCODONE-acetaminophen (PERCOCET) 5-325 MG tablet Take 1 tablet by mouth every 8 (eight) hours as needed. 12/27/18   Emily Filbert, MD    Family History Family History  Problem Relation Age of Onset   Healthy Mother    Heart failure Father    COPD Paternal Grandmother     Social History Social History   Tobacco Use   Smoking status: Former    Packs/day: 1.00    Years: 18.00    Pack years: 18.00    Types: Cigarettes   Smokeless tobacco: Never  Vaping Use   Vaping Use: Never used  Substance Use Topics   Alcohol use: Yes    Comment: occ.   Drug use: No     Allergies   Penicillins, Bee venom, and Other   Review of Systems Review of Systems  As stated above in HPI Physical Exam Triage Vital Signs ED Triage Vitals  Enc Vitals Group     BP 09/22/21 1052 108/68     Pulse Rate 09/22/21 1052 62     Resp 09/22/21 1052 15     Temp 09/22/21 1052 97.7 F (36.5 C)     Temp Source 09/22/21 1052 Oral  SpO2 09/22/21 1052 100 %     Weight 09/22/21 1049 149 lb 14.6 oz (68 kg)     Height 09/22/21 1049 5\' 10"  (1.778 m)     Head Circumference --      Peak Flow --      Pain Score 09/22/21 1048 7     Pain Loc --      Pain Edu? --      Excl. in GC? --    No data found.  Updated Vital Signs BP 108/68 (BP Location: Right Arm)    Pulse 62    Temp 97.7 F (36.5 C) (Oral)    Resp 15    Ht 5\' 10"  (1.778 m)    Wt 169 lb 1.6 oz (76.7 kg)    SpO2 100%    BMI 24.26 kg/m   Physical Exam Vitals and nursing note reviewed.  Constitutional:      General: He is not in acute distress.    Appearance: Normal appearance. He is not ill-appearing, toxic-appearing or diaphoretic.  HENT:     Ears:     Comments: There is an abscess of the left posterior lower left ear lobe. No active drainage.  Cardiovascular:     Rate and Rhythm: Normal rate and regular rhythm.     Heart sounds: Normal heart sounds.  Pulmonary:     Effort: Pulmonary effort is normal.     Breath sounds: Normal  breath sounds.  Lymphadenopathy:     Cervical: Cervical adenopathy present.  Neurological:     Mental Status: He is alert.     UC Treatments / Results  Labs (all labs ordered are listed, but only abnormal results are displayed) Labs Reviewed - No data to display  EKG   Radiology No results found.  Procedures Procedures (including critical care time)  Medications Ordered in UC Medications - No data to display  Initial Impression / Assessment and Plan / UC Course  I have reviewed the triage vital signs and the nursing notes.  Pertinent labs & imaging results that were available during my care of the patient were reviewed by me and considered in my medical decision making (see chart for details).     New.  Abscess vs infected cyst.  Discussed I&D versus starting ABX and Toradol for pain and inflammation.  He prefers ABX and Toradol.  He is up-to-date on his Tdap.  Discussed red flag signs and symptoms.  Follow-up as needed. Final Clinical Impressions(s) / UC Diagnoses   Final diagnoses:  None   Discharge Instructions   None    ED Prescriptions   None    PDMP not reviewed this encounter.   11/20/21, 09/22/21 1220

## 2021-09-22 NOTE — ED Triage Notes (Signed)
Patient has a tender red swollen bump behind his left ear that started on Thursday.  Patient denies fevers.

## 2023-12-09 ENCOUNTER — Emergency Department
Admission: EM | Admit: 2023-12-09 | Discharge: 2023-12-10 | Disposition: A | Discharge status: Home / Self Care | Payer: Self-pay | Attending: Emergency Medicine | Admitting: Emergency Medicine

## 2023-12-09 ENCOUNTER — Emergency Department: Payer: Self-pay

## 2023-12-09 ENCOUNTER — Other Ambulatory Visit: Payer: Self-pay

## 2023-12-09 DIAGNOSIS — T40715A Adverse effect of cannabis, initial encounter: Secondary | ICD-10-CM | POA: Insufficient documentation

## 2023-12-09 DIAGNOSIS — R0789 Other chest pain: Secondary | ICD-10-CM | POA: Insufficient documentation

## 2023-12-09 DIAGNOSIS — T887XXA Unspecified adverse effect of drug or medicament, initial encounter: Secondary | ICD-10-CM

## 2023-12-09 LAB — CBC WITH DIFFERENTIAL/PLATELET
Abs Immature Granulocytes: 0.03 10*3/uL (ref 0.00–0.07)
Basophils Absolute: 0.1 10*3/uL (ref 0.0–0.1)
Basophils Relative: 2 %
Eosinophils Absolute: 0.4 10*3/uL (ref 0.0–0.5)
Eosinophils Relative: 6 %
HCT: 36.2 % — ABNORMAL LOW (ref 39.0–52.0)
Hemoglobin: 12.8 g/dL — ABNORMAL LOW (ref 13.0–17.0)
Immature Granulocytes: 0 %
Lymphocytes Relative: 28 %
Lymphs Abs: 2.1 10*3/uL (ref 0.7–4.0)
MCH: 30.3 pg (ref 26.0–34.0)
MCHC: 35.4 g/dL (ref 30.0–36.0)
MCV: 85.6 fL (ref 80.0–100.0)
Monocytes Absolute: 0.7 10*3/uL (ref 0.1–1.0)
Monocytes Relative: 9 %
Neutro Abs: 4.1 10*3/uL (ref 1.7–7.7)
Neutrophils Relative %: 55 %
Platelets: 265 10*3/uL (ref 150–400)
RBC: 4.23 MIL/uL (ref 4.22–5.81)
RDW: 11.7 % (ref 11.5–15.5)
WBC: 7.4 10*3/uL (ref 4.0–10.5)
nRBC: 0 % (ref 0.0–0.2)

## 2023-12-09 LAB — COMPREHENSIVE METABOLIC PANEL WITH GFR
ALT: 35 U/L (ref 0–44)
AST: 27 U/L (ref 15–41)
Albumin: 4.3 g/dL (ref 3.5–5.0)
Alkaline Phosphatase: 71 U/L (ref 38–126)
Anion gap: 9 (ref 5–15)
BUN: 17 mg/dL (ref 6–20)
CO2: 24 mmol/L (ref 22–32)
Calcium: 8.6 mg/dL — ABNORMAL LOW (ref 8.9–10.3)
Chloride: 105 mmol/L (ref 98–111)
Creatinine, Ser: 0.92 mg/dL (ref 0.61–1.24)
GFR, Estimated: >60 mL/min (ref >60)
Glucose, Bld: 98 mg/dL (ref 70–99)
Potassium: 3.3 mmol/L — ABNORMAL LOW (ref 3.5–5.1)
Sodium: 138 mmol/L (ref 135–145)
Total Bilirubin: 0.3 mg/dL (ref 0.0–1.2)
Total Protein: 6.6 g/dL (ref 6.5–8.1)

## 2023-12-09 LAB — TROPONIN I (HIGH SENSITIVITY)
Troponin I (High Sensitivity): 3 ng/L (ref <18)
Troponin I (High Sensitivity): 5 ng/L (ref <18)

## 2023-12-09 MED ORDER — LORAZEPAM 2 MG/ML IJ SOLN
1.0000 mg | Freq: Once | INTRAMUSCULAR | Status: AC
  Administered 2023-12-09: 1 mg via INTRAVENOUS
  Filled 2023-12-09: qty 1

## 2023-12-09 NOTE — ED Provider Notes (Signed)
 [facility] Provider Note    Event Date/Time   First MD Initiated Contact with Patient 12/09/23 2101     (approximate)   History   Chest Pain   HPI  ERLAND VIVAS is a 38 y.o. male with history of GERD and migraine headaches who presents with chest pain, acute onset about 2 hours ago after he took a delta 8 gummy in the normal dose that he has taken previously.  He states he started to have chest pain as if someone was reaching through and twisting his chest associated with some pain on respiration, lightheadedness, and anxiety.  He states that the symptoms are improved although still present.  He denies any history of prior heart problems.  He has no leg pain or swelling.  I reviewed past medical records.  The patient's most recent outpatient counter was with [place] [facility] in 2023 for an abscess to find his ear.   Physical Exam   Triage Vital Signs: ED Triage Vitals  Encounter Vitals Group     BP 12/09/23 2102 (!) 146/80     Systolic BP Percentile --      Diastolic BP Percentile --      Pulse Rate 12/09/23 2102 (!) 105     Resp 12/09/23 2102 18     Temp --      Temp src --      SpO2 12/09/23 2102 99 %     Weight --      Height --      Head Circumference --      Peak Flow --      Pain Score 12/09/23 2103 3     Pain Loc --      Pain Education --      Exclude from Growth Chart --     Most recent vital signs: Vitals:   12/09/23 2102 12/09/23 2158  BP: (!) 146/80   Pulse: (!) 105   Resp: 18   Temp:  97.9 F (36.6 C)  SpO2: 99%      General: Alert, anxious appearing, no distress.  CV:  Good peripheral perfusion.  Normal heart sounds. Resp:  Normal effort.  Lungs CTAB. Abd:  No distention.  Other:  No peripheral edema.   ED Results / Procedures / Treatments   Labs (all labs ordered are listed, but only abnormal results are displayed) Labs Reviewed  COMPREHENSIVE METABOLIC PANEL WITH GFR - Abnormal; Notable for the following  components:      Result Value   Potassium 3.3 (*)    Calcium 8.6 (*)    All other components within normal limits  CBC WITH DIFFERENTIAL/PLATELET - Abnormal; Notable for the following components:   Hemoglobin 12.8 (*)    HCT 36.2 (*)    All other components within normal limits  TROPONIN I (HIGH SENSITIVITY)  TROPONIN I (HIGH SENSITIVITY)     EKG  ED ECG REPORT I, Lind Repine, the attending physician, personally viewed and interpreted this ECG.  Date: 12/09/2023 EKG Time: 2058 Rate: 99 Rhythm: normal sinus rhythm QRS Axis: normal Intervals: normal ST/T Wave abnormalities: normal Narrative Interpretation: no evidence of acute ischemia    RADIOLOGY  Chest x-ray: I independently viewed and interpreted the images; there is no focal consolidation or edema   PROCEDURES:  Critical Care performed: No  Procedures   MEDICATIONS ORDERED IN ED: Medications  LORazepam  (ATIVAN ) injection 1 mg (1 mg Intravenous Given 12/09/23 2118)     IMPRESSION /  MDM / ASSESSMENT AND PLAN / ED COURSE  I reviewed the triage vital signs and the nursing notes.  38 year old male with PMH as noted above presents with acute onset of chest pain, shortness of breath, lightheadedness after taking delta 8 gummy.  He is anxious as well.  The symptoms are improving overall.  On exam his vital signs are normal except for borderline tachycardia.  Physical exam is otherwise unremarkable for acute findings.  EKG is nonischemic.  Differential diagnosis includes, but is not limited to, reaction to the drug, acute anxiety, dehydration, electrolyte abnormality, less likely cardiac dysrhythmia or ACS.  Although the patient is borderline tachycardic, given the acute onset of the symptoms related to taking the delta 8 as well as the normal EKG, there is no clinical evidence for PE.  The patient has no DVT symptoms.  There is also no clinical evidence for aortic dissection or other vascular etiology.  Will  obtain basic labs, cardiac enzymes, chest x-ray, give a dose of Ativan , and reassess.  Patient's presentation is most consistent with acute complicated illness / injury requiring diagnostic workup.  The patient is on the cardiac monitor to evaluate for evidence of arrhythmia and/or significant heart rate changes.  ----------------------------------------- 10:56 PM on 12/09/2023 -----------------------------------------  Initial troponin is negative.  CBC shows no acute findings.  CMP is unremarkable.  Chest x-ray is clear.  The patient states that his symptoms have continued to improve.  We will obtain a repeat troponin after 2 hours.  If this is negative and the patient continues to improve anticipate discharge home.  He will be signed out to the oncoming ED physician at 11 PM.   FINAL CLINICAL IMPRESSION(S) / ED DIAGNOSES   Final diagnoses:  Atypical chest pain  Drug side effects     Rx / DC Orders   ED Discharge Orders     None        Note:  This document was prepared using Dragon voice recognition software and may include unintentional dictation errors.    Lind Repine, MD 12/09/23 2256

## 2023-12-09 NOTE — ED Triage Notes (Signed)
 Pt arrived per EMS from home. Pt states he took his regular dose of Delta-8 at 2030 and felt like he was "kicked in the chest." Pt reports left sided CP that radiates into his back and shoulder blades. Denies SOB, but painful with inspiration.   EMS administered 324 mg of ASA en route. A&Ox3.   BP 153/85, HR 111, 98.1 F, 100% SpO2 on RA.

## 2023-12-09 NOTE — ED Notes (Signed)
 Patient transported to X-ray

## 2023-12-09 NOTE — ED Provider Notes (Signed)
-----------------------------------------   11:20 PM on 12/09/2023 -----------------------------------------   Assumed care of patient who was resting comfortably.  Awaiting repeat troponin.  ----------------------------------------- 12:01 AM on 12/10/2023 -----------------------------------------   Repeat troponin negative.  Patient resting in no acute distress.  Family at bedside.  Strict return precautions given.  Both verbalized understanding and agree with plan of care.   Livio Ledwith J, MD 12/10/23 [phone]

## 2023-12-09 NOTE — Discharge Instructions (Addendum)
 Avoid use of delta 8 Gummies.  Return to the ER for worsening symptoms, persistent vomiting, difficulty breathing or other concerns.

## 2023-12-10 NOTE — ED Notes (Addendum)
..  The patient is A&OX4, ambulatory at d/c with independent steady gait, NAD but was wheeled out of ED via wheelchair. Pt verbalized understanding of d/c instructions and follow up care. Pts mother will be driving him home.
